# Patient Record
Sex: Female | Born: 1935 | Race: White | Hispanic: No | Marital: Married | Smoking: Never smoker
Health system: Southern US, Community
[De-identification: ages and names within clinical notes are randomized; demographics above are authoritative.]

## PROBLEM LIST (undated history)

## (undated) DIAGNOSIS — C50919 Malignant neoplasm of unspecified site of unspecified female breast: Secondary | ICD-10-CM

## (undated) DIAGNOSIS — K219 Gastro-esophageal reflux disease without esophagitis: Secondary | ICD-10-CM

## (undated) DIAGNOSIS — I10 Essential (primary) hypertension: Secondary | ICD-10-CM

## (undated) DIAGNOSIS — M6281 Muscle weakness (generalized): Secondary | ICD-10-CM

## (undated) DIAGNOSIS — E039 Hypothyroidism, unspecified: Secondary | ICD-10-CM

## (undated) DIAGNOSIS — F0390 Unspecified dementia without behavioral disturbance: Secondary | ICD-10-CM

## (undated) HISTORY — PX: JOINT REPLACEMENT: SHX530

---

## 1999-03-05 DIAGNOSIS — C50919 Malignant neoplasm of unspecified site of unspecified female breast: Secondary | ICD-10-CM

## 1999-03-05 HISTORY — PX: BREAST EXCISIONAL BIOPSY: SUR124

## 1999-03-05 HISTORY — DX: Malignant neoplasm of unspecified site of unspecified female breast: C50.919

## 2003-12-06 ENCOUNTER — Ambulatory Visit: Payer: Self-pay | Admitting: Occupational Therapy

## 2004-03-23 ENCOUNTER — Ambulatory Visit: Payer: Self-pay | Admitting: Oncology

## 2004-08-10 ENCOUNTER — Ambulatory Visit: Payer: Self-pay | Admitting: Oncology

## 2004-09-06 ENCOUNTER — Ambulatory Visit: Payer: Self-pay | Admitting: Oncology

## 2004-10-02 ENCOUNTER — Ambulatory Visit: Payer: Self-pay | Admitting: Oncology

## 2005-03-13 ENCOUNTER — Ambulatory Visit: Payer: Self-pay | Admitting: Oncology

## 2005-04-04 ENCOUNTER — Ambulatory Visit: Payer: Self-pay | Admitting: Oncology

## 2005-08-12 ENCOUNTER — Ambulatory Visit: Payer: Self-pay | Admitting: Oncology

## 2005-09-09 ENCOUNTER — Ambulatory Visit: Payer: Self-pay | Admitting: Oncology

## 2006-03-12 ENCOUNTER — Ambulatory Visit: Payer: Self-pay | Admitting: Oncology

## 2006-04-04 ENCOUNTER — Ambulatory Visit: Payer: Self-pay | Admitting: Oncology

## 2006-08-18 ENCOUNTER — Ambulatory Visit: Payer: Self-pay | Admitting: Oncology

## 2007-03-05 ENCOUNTER — Ambulatory Visit: Payer: Self-pay | Admitting: Oncology

## 2007-03-23 ENCOUNTER — Ambulatory Visit: Payer: Self-pay | Admitting: Oncology

## 2007-04-05 ENCOUNTER — Ambulatory Visit: Payer: Self-pay | Admitting: Oncology

## 2007-06-09 ENCOUNTER — Encounter: Payer: Self-pay | Admitting: Unknown Physician Specialty

## 2007-07-03 ENCOUNTER — Encounter: Payer: Self-pay | Admitting: Unknown Physician Specialty

## 2007-08-03 ENCOUNTER — Encounter: Payer: Self-pay | Admitting: Unknown Physician Specialty

## 2007-08-21 ENCOUNTER — Ambulatory Visit: Payer: Self-pay | Admitting: Oncology

## 2007-09-02 ENCOUNTER — Ambulatory Visit: Payer: Self-pay | Admitting: Oncology

## 2007-09-08 ENCOUNTER — Ambulatory Visit: Payer: Self-pay | Admitting: Oncology

## 2007-10-03 ENCOUNTER — Ambulatory Visit: Payer: Self-pay | Admitting: Oncology

## 2008-08-22 ENCOUNTER — Ambulatory Visit: Payer: Self-pay | Admitting: Oncology

## 2009-08-02 ENCOUNTER — Ambulatory Visit: Payer: Self-pay | Admitting: Oncology

## 2009-08-23 ENCOUNTER — Ambulatory Visit: Payer: Self-pay | Admitting: Oncology

## 2009-08-25 ENCOUNTER — Ambulatory Visit: Payer: Self-pay | Admitting: Oncology

## 2009-09-01 ENCOUNTER — Ambulatory Visit: Payer: Self-pay | Admitting: Oncology

## 2010-08-27 ENCOUNTER — Ambulatory Visit: Payer: Self-pay | Admitting: Oncology

## 2010-08-28 ENCOUNTER — Ambulatory Visit: Payer: Self-pay | Admitting: Oncology

## 2010-09-02 ENCOUNTER — Ambulatory Visit: Payer: Self-pay | Admitting: Oncology

## 2011-02-09 ENCOUNTER — Emergency Department: Payer: Self-pay | Admitting: *Deleted

## 2011-03-05 HISTORY — PX: OTHER SURGICAL HISTORY: SHX169

## 2011-08-27 ENCOUNTER — Ambulatory Visit: Payer: Self-pay | Admitting: Oncology

## 2011-08-29 ENCOUNTER — Ambulatory Visit: Payer: Self-pay | Admitting: Oncology

## 2011-11-19 ENCOUNTER — Inpatient Hospital Stay: Payer: Self-pay | Admitting: Specialist

## 2011-11-19 LAB — URINALYSIS, COMPLETE
Bacteria: NONE SEEN
Blood: NEGATIVE
Glucose,UR: NEGATIVE mg/dL (ref 0–75)
Nitrite: NEGATIVE
Ph: 6 (ref 4.5–8.0)
Protein: NEGATIVE
RBC,UR: <1 /HPF (ref 0–5)
Specific Gravity: 1.013 (ref 1.003–1.030)
Squamous Epithelial: NONE SEEN

## 2011-11-19 LAB — COMPREHENSIVE METABOLIC PANEL
Albumin: 4.1 g/dL (ref 3.4–5.0)
Alkaline Phosphatase: 83 U/L (ref 50–136)
Anion Gap: 10 (ref 7–16)
BUN: 15 mg/dL (ref 7–18)
Bilirubin,Total: 0.6 mg/dL (ref 0.2–1.0)
Calcium, Total: 9.4 mg/dL (ref 8.5–10.1)
Chloride: 107 mmol/L (ref 98–107)
Co2: 25 mmol/L (ref 21–32)
Creatinine: 0.65 mg/dL (ref 0.60–1.30)
EGFR (African American): >60
EGFR (Non-African Amer.): >60
Osmolality: 286 (ref 275–301)
Potassium: 3.8 mmol/L (ref 3.5–5.1)

## 2011-11-19 LAB — CBC WITH DIFFERENTIAL/PLATELET
Basophil #: 0 10*3/uL (ref 0.0–0.1)
Basophil %: 0.4 %
Eosinophil #: 0 10*3/uL (ref 0.0–0.7)
Eosinophil %: 0.5 %
HCT: 47.7 % — ABNORMAL HIGH (ref 35.0–47.0)
Lymphocyte #: 0.7 10*3/uL — ABNORMAL LOW (ref 1.0–3.6)
MCV: 91 fL (ref 80–100)
Monocyte #: 0.7 x10 3/mm (ref 0.2–0.9)
Platelet: 147 10*3/uL — ABNORMAL LOW (ref 150–440)
RBC: 5.26 10*6/uL — ABNORMAL HIGH (ref 3.80–5.20)
WBC: 10.7 10*3/uL (ref 3.6–11.0)

## 2011-11-19 LAB — PROTIME-INR
INR: 1
Prothrombin Time: 13.7 secs (ref 11.5–14.7)

## 2011-11-20 LAB — BASIC METABOLIC PANEL
Anion Gap: 9 (ref 7–16)
BUN: 14 mg/dL (ref 7–18)
Calcium, Total: 8.6 mg/dL (ref 8.5–10.1)
Chloride: 104 mmol/L (ref 98–107)
Co2: 27 mmol/L (ref 21–32)
Creatinine: 0.64 mg/dL (ref 0.60–1.30)
EGFR (African American): >60
EGFR (Non-African Amer.): >60
Glucose: 123 mg/dL — ABNORMAL HIGH (ref 65–99)
Osmolality: 281 (ref 275–301)
Potassium: 3.4 mmol/L — ABNORMAL LOW (ref 3.5–5.1)
Sodium: 140 mmol/L (ref 136–145)

## 2011-11-20 LAB — HEPATIC FUNCTION PANEL A (ARMC)
Alkaline Phosphatase: 66 U/L (ref 50–136)
SGOT(AST): 56 U/L — ABNORMAL HIGH (ref 15–37)
SGPT (ALT): 65 U/L (ref 12–78)
Total Protein: 6.1 g/dL — ABNORMAL LOW (ref 6.4–8.2)

## 2011-11-20 LAB — CBC WITH DIFFERENTIAL/PLATELET
Basophil %: 0.7 %
Eosinophil %: 2 %
HGB: 13.9 g/dL (ref 12.0–16.0)
Lymphocyte #: 0.6 10*3/uL — ABNORMAL LOW (ref 1.0–3.6)
MCH: 31.2 pg (ref 26.0–34.0)
MCV: 91 fL (ref 80–100)
Monocyte #: 0.5 x10 3/mm (ref 0.2–0.9)
Monocyte %: 6.4 %
Platelet: 108 10*3/uL — ABNORMAL LOW (ref 150–440)
RBC: 4.46 10*6/uL (ref 3.80–5.20)
RDW: 13.4 % (ref 11.5–14.5)
WBC: 8.1 10*3/uL (ref 3.6–11.0)

## 2011-11-21 LAB — CBC WITH DIFFERENTIAL/PLATELET
Basophil #: 0 10*3/uL (ref 0.0–0.1)
Basophil %: 0.8 %
Eosinophil %: 2.5 %
HCT: 38.6 % (ref 35.0–47.0)
HGB: 12.7 g/dL (ref 12.0–16.0)
Lymphocyte #: 0.6 10*3/uL — ABNORMAL LOW (ref 1.0–3.6)
Lymphocyte %: 11 %
MCH: 30.1 pg (ref 26.0–34.0)
MCHC: 33 g/dL (ref 32.0–36.0)
Monocyte #: 0.5 x10 3/mm (ref 0.2–0.9)
Monocyte %: 8.9 %
Platelet: 109 10*3/uL — ABNORMAL LOW (ref 150–440)
RBC: 4.23 10*6/uL (ref 3.80–5.20)

## 2011-11-21 LAB — BASIC METABOLIC PANEL
Anion Gap: 6 — ABNORMAL LOW (ref 7–16)
BUN: 9 mg/dL (ref 7–18)
Calcium, Total: 8.5 mg/dL (ref 8.5–10.1)
Chloride: 101 mmol/L (ref 98–107)
Co2: 30 mmol/L (ref 21–32)
EGFR (African American): >60
Osmolality: 273 (ref 275–301)

## 2011-11-22 LAB — BASIC METABOLIC PANEL
Anion Gap: 8 (ref 7–16)
BUN: 9 mg/dL (ref 7–18)
Chloride: 105 mmol/L (ref 98–107)
Creatinine: 0.55 mg/dL — ABNORMAL LOW (ref 0.60–1.30)
EGFR (Non-African Amer.): >60
Glucose: 104 mg/dL — ABNORMAL HIGH (ref 65–99)
Osmolality: 280 (ref 275–301)
Potassium: 3.4 mmol/L — ABNORMAL LOW (ref 3.5–5.1)
Sodium: 141 mmol/L (ref 136–145)

## 2011-11-22 LAB — HEMOGLOBIN: HGB: 12.2 g/dL (ref 12.0–16.0)

## 2011-11-25 LAB — PATHOLOGY REPORT

## 2012-10-29 ENCOUNTER — Ambulatory Visit: Payer: Self-pay

## 2013-10-15 ENCOUNTER — Ambulatory Visit: Payer: Self-pay

## 2014-06-21 NOTE — Consult Note (Signed)
PATIENT NAME:  Anita Powell, Anita Powell MR#:  [id number] DATE OF BIRTH:  [birth year]  DATE OF CONSULTATION:  11/19/2011  REFERRING PHYSICIAN:  Dr. Earnestine Leys  CONSULTING PHYSICIAN:  Belia Heman. Verdell Carmine, MD  PRIMARY CARE PHYSICIAN: Erma Pinto, FNP-C   REASON FOR CONSULTATION: Preoperative medical evaluation and medical management.   HISTORY OF PRESENT ILLNESS: This is an 81 year old female who apparently had a mechanical fall at a grocery store earlier today. She apparently drove herself home but was unable to bear any weight on her left lower extremity. She then called EMS and she was brought to the ER. She was noted to have an acute left femoral neck fracture. Hospitalist services were contacted for preoperative medical evaluation and medical management. Patient says her fall was likely mechanical in nature. She had no evidence of syncope. She had no chest pain, no dizziness, no palpitations, no shortness of breath and no other associated symptoms prior to her fall.   REVIEW OF SYSTEMS: CONSTITUTIONAL: No documented fever. No weight gain, no weight loss. EYES: No blurry or double vision. ENT: No tinnitus. No postnasal drip. No redness of the oropharynx. RESPIRATORY: No cough, no wheeze, no hemoptysis. CARDIOVASCULAR: No chest pain, no orthopnea, no palpitations, no syncope. GASTROINTESTINAL: No nausea, no vomiting, no diarrhea, no abdominal pain, no melena, no hematochezia. GENITOURINARY: No dysuria, no  hematuria. ENDOCRINE: No polyuria or nocturia. No heat or cold intolerance. HEME: No anemia, no bruising, no bleeding. INTEGUMENTARY: No rashes, no lesions. MUSCULOSKELETAL: No arthritis, no swelling, no gout. NEUROLOGIC: No numbness, no tingling, no ataxia, no seizure-type activity. PSYCH: No anxiety, no insomnia, no ADD.   PAST MEDICAL HISTORY:  1. Hypertension.  2. Hyperlipidemia.  3. Gastroesophageal reflux disease.  4. Hypothyroidism.   ALLERGIES: Sulfa drugs and penicillin.   SOCIAL HISTORY:  Used to smoke, quit many years ago. Does have 10 to 12 pack-year smoking history. No alcohol abuse. No illicit drug abuse. Lives at home with her husband.   FAMILY HISTORY: Mother died from pancreatic cancer. Father died from complications of heart disease.   CURRENT MEDICATIONS:  1. Aspirin 81 mg daily.  2. Synthroid 137 mcg daily.  3. Toprol 50 mg daily.  4. Omeprazole 20 mg daily.  5. Potassium 10 mEq daily.  6. Simvastatin 20 mg daily.  7. Vitamin D3 2000 international units daily.   PHYSICAL EXAMINATION ON ADMISSION:  VITAL SIGNS: Patient's vital signs are noted to be: Temperature 98.6, pulse 68, respirations 18, blood pressure 154/66, sats 92% on room air.   GENERAL: She is a pleasant appearing female in no apparent distress.   HEENT: She is atraumatic, normocephalic. Extraocular muscles are intact. Pupils equal, reactive to light. Sclerae anicteric. No conjunctival injection. No pharyngeal erythema.   NECK: Supple. There is no jugular venous distention, no bruits, no lymphadenopathy, no thyromegaly.   HEART: Regular rate and rhythm. No murmurs, no rubs, no clicks.   LUNGS: Clear to auscultation bilaterally. No rales, no rhonchi, no wheezes.   ABDOMEN: Soft, flat, nontender, nondistended. Has good bowel sounds. No hepatosplenomegaly appreciated.   EXTREMITIES: No evidence of any cyanosis, clubbing, or peripheral edema. She has +2 pedal and radial pulses bilaterally. Her left lower extremity is shortened and externally rotated due to the left femoral neck fracture.   SKIN: Moist, warm with no rashes.   LYMPHATIC: There is no cervical or axillary lymphadenopathy.   LABORATORY, DIAGNOSTIC, AND RADIOLOGICAL DATA: Serum glucose 128, BUN 15, creatinine 0.6, sodium 142, potassium 3.8, chloride 107, bicarbonate 25,  albumin 4.1. LFTs are mildly elevated AST 88, ALT 85. White cell count 10.7, hemoglobin 16.2, hematocrit 47.7, platelet count 147. Urinalysis is within normal limits.    Patient did have an x-ray of her hip showing a proximal left femoral fracture of the neck that appears to be subcapital.   ASSESSMENT AND PLAN: This is an 81 year old female with past medical history of hypertension, hyperlipidemia, hypothyroidism, gastroesophageal reflux disease came into the hospital after a fall and noted to have a left hip fracture.  1. Preoperative evaluation. Patient is likely a low to moderate risk for noncardiac surgery. There is no absolute contraindication to surgery at this time. Continue preoperative beta blocker. Patient's EKG has been reviewed, showed no acute changes.  2. Hypertension. Presently hemodynamically stable. Continue her Toprol. 3. Hypothyroidism. Continue Synthroid.  4. Hyperlipidemia. Continue simvastatin.  5. Abnormal liver function tests. This is a very mild LFT elevation, etiology unclear. Will go ahead and repeat her labs in the morning. It could be traumatic related to possibly her hip fracture.  6. CODE STATUS: Patient is a FULL CODE.   Thank you so much for the consultation. Will follow along with you.   TIME SPENT: 45 minutes.   ____________________________ Belia Heman. Verdell Carmine, MD vjs:cms D: 11/19/2011 18:16:35 ET T: 11/20/2011 08:09:08 ET JOB#: [id number]  cc: Belia Heman. Verdell Carmine, MD, <Dictator> Eather Colas. Sharlett Iles, FNP-C Henreitta Leber MD ELECTRONICALLY SIGNED 11/29/2011 12:16

## 2014-06-21 NOTE — Discharge Summary (Signed)
PATIENT NAME:  Anita Powell, Anita Powell MR#:  [id number] DATE OF BIRTH:  [birth year]  DATE OF ADMISSION:  11/19/2011 DATE OF DISCHARGE:  11/25/2011  FINAL DIAGNOSES:  1. Displaced subcapital fracture of the left hip.  2. Hypertension.  3. Hyperlipidemia.  4. Gastric reflux.  5. Hypothyroidism.  6. Mild confusion.  OPERATION: On 11/20/2011 left hip hemiarthroplasty with a Stryker Accolade prosthesis.   COMPLICATIONS: None.   CONSULTATION:  [facility].   DISCHARGE MEDICATIONS:  1. Vitamin D3 2000 units daily.  2. Synthroid 0.137 mg q. a.m. 3. Toprol-XL 50 mg daily.  4. Prilosec 20 mg q. a.m.  5. Zocor 10 mg at bedtime.  6. K-Dur 10 mEq daily.  7. Enteric-coated aspirin one p.o. b.i.d.  8. Norco 5/325 p.r.n. pain.   HISTORY OF PRESENT ILLNESS: The patient is an 81 year old female who fell in the parking lot at [employer] in [place] on the day of admission. She was able to get up and limp o her car and drove home. She was then brought to the [facility] Emergency Room by EMS. Exam and x-rays showed a displaced subcapital fracture of the left hip. She was admitted for medical evaluation and surgery.   PAST MEDICAL HISTORY:/ILLNESSES: As above.   MEDICATIONS: As above.   ALLERGIES: Sulfa and penicillin.  REVIEW OF SYSTEMS: Unremarkable.   FAMILY HISTORY: Mother died from pancreatic cancer. Father died from heart disease.   SOCIAL HISTORY: The patient quit smoking years ago. She does not drink. She lives at home with her husband.   PHYSICAL EXAMINATION: On admission, the patient was alert and cooperative. She was oriented. The left leg was shortened and externally rotated. There was severe pain with motion of the left hip. Neurovascular status was good. The skin was intact.   LABORATORY DATA: Laboratory data on admission was satisfactory.   HOSPITAL COURSE: On 11/20/2011 the patient was taken to surgery where she underwent a left hip hemiarthroplasty with a Stryker Accolade prosthesis. She did  well postoperatively with no significant drop in hemoglobin. She was gradually ambulated. She did have some confusion for a day or two but this cleared. She was making progress with physical therapy. She was stable and ready for skilled nursing discharge on 11/25/2011. She is to be partial weight-bearing on the left leg and to be seen in my office in two weeks for exam and x-ray.  Her rehabilitation potential is good.     ____________________________ Park Breed, MD hem:bjt D: 11/25/2011 12:57:20 ET T: 11/25/2011 13:11:36 ET JOB#: [id number]  cc: Park Breed, MD, <Dictator> Eather Colas. Sharlett Iles, FNP-C Park Breed MD ELECTRONICALLY SIGNED 11/26/2011 11:37

## 2014-06-21 NOTE — Consult Note (Signed)
Brief Consult Note: Diagnosis: 1. Pre-operative evaluation 2. HTN 3. Hypothyroidism 4. Hyperlipidemia 5. GERD.   Patient was seen by consultant.   Consult note dictated.   Recommend to proceed with surgery or procedure.   Orders entered.   Comments: 81 yo female w/ hx of HTN, Hyperlipidemia, Hypothyroidism, GERD, came into hospital after a fall and noted to have left hip fracture.   1. Pre-operative evaluation - pt. is likely a low to moderate risk for non-cardiac surgery.  - no contraindications to surgery at this time.   - cont. pre-operative B-blocker.  2. HTN - cont. Toprol 3. Hypothyroidism - cont. synthroid.  4. Hyperlipidemia - cont. Simvastatin  ECG reviewed.  Thanks for the consult and will follow with you.  Job # [id number].  Electronic Signatures: Henreitta Leber (MD)  (Signed 17-Sep-13 18:16)  Authored: Brief Consult Note   Last Updated: 17-Sep-13 18:16 by Henreitta Leber (MD)

## 2014-06-21 NOTE — H&P (Signed)
Subjective/Chief Complaint Pain left hip    History of Present Illness 81 year old female fell in parking lot at [employer] in [place] this AM injuring the left hip.  Drove home and then was brought to [facility] where exam and X-rays show a displaced subcapital fracture left hip.  Admit for medical evaluation and surgery tomorrow.  Discussed treatment options with patient and husband by phone and they agree with surgical treatment.  Risks and benefits of surgery were discussed at length including but not limited to infection, non union, nerve or blood vessed damage, non union, need for repeat surgery, blood clots and lung emboli, and death.    Past Medical Health Hypertension, Cancer, Breast 9 yrs ago   GERD   Hypothyroid    Primary Physician Laray Anger   ALLERGIES:  Sulfa drugs: Unknown  HOME MEDICATIONS: Medication Instructions Status  levothyroxine 137 mcg (0.137 mg) oral tablet 1 tab(s) orally once a day Active  aspirin 81 mg oral tablet 1 tab(s) orally once a day Active  metoprolol 50 mg oral tablet, extended release 1 tab(s) orally once a day Active  Vitamin D3 2000 intl units oral capsule 1 cap(s) orally once a day Active  omeprazole 20 mg oral delayed release capsule 1 cap(s) orally once a day Active  simvastatin 20 mg oral tablet 0.5 tab(s) orally once a day Active  potassium chloride 10 mEq oral tablet, extended release 1 tab(s) orally once a day Active   Family and Social History:   Family History Non-Contributory    Social History negative tobacco, negative ETOH    Place of Living Home   Review of Systems:   Fever/Chills No    Cough No    Sputum No    Abdominal Pain No   Physical Exam:   GEN well developed, well nourished, no acute distress    HEENT pink conjunctivae    NECK supple    RESP normal resp effort    CARD regular rate    ABD denies tenderness    GU foley catheter in place    LYMPH negative neck    EXTR  negative edema, Left leg short and rotated.  circulation/sensation/motor function good  pain with range of motion.  skin intact    SKIN normal to palpation    NEURO motor/sensory function intact    PSYCH alert, A+O to time, place, person, good insight   Lab Results: Hepatic:  17-Sep-13 15:59    Bilirubin, Total 0.6   Alkaline Phosphatase 83   SGPT (ALT)  85   SGOT (AST)  88   Total Protein, Serum 7.7   Albumin, Serum 4.1  Routine Chem:  17-Sep-13 15:59    Glucose, Serum  128   BUN 15   Creatinine (comp) 0.65   Sodium, Serum 142   Potassium, Serum 3.8   Chloride, Serum 107   CO2, Serum 25   Calcium (Total), Serum 9.4   Osmolality (calc) 286   eGFR (African American) >60   eGFR (Non-African American) >60 (eGFR values <73m/min/1.73 m2 may be an indication of chronic kidney disease (CKD). Calculated eGFR is useful in patients with stable renal function. The eGFR calculation will not be reliable in acutely ill patients when serum creatinine is changing rapidly. It is not useful in  patients on dialysis. The eGFR calculation may not be applicable to patients at the low and high extremes of body sizes, pregnant women, and vegetarians.)   Anion Gap 10  Routine UA:  17-Sep-13 15:59    Color (UA) Yellow   Clarity (UA) Clear   Glucose (UA) Negative   Bilirubin (UA) Negative   Ketones (UA) Trace   Specific Gravity (UA) 1.013   Blood (UA) Negative   pH (UA) 6.0   Protein (UA) Negative   Nitrite (UA) Negative   Leukocyte Esterase (UA) Negative (Result(s) reported on 19 Nov 2011 at 04:31PM.)   RBC (UA) <1 /HPF   WBC (UA) <1 /HPF   Bacteria (UA) NONE SEEN   Epithelial Cells (UA) NONE SEEN   Mucous (UA) PRESENT (Result(s) reported on 19 Nov 2011 at 04:31PM.)  Routine Hem:  17-Sep-13 15:59    WBC (CBC) 10.7   RBC (CBC)  5.26   Hemoglobin (CBC)  16.2   Hematocrit (CBC)  47.7   Platelet Count (CBC)  147   MCV 91   MCH 30.9   MCHC 34.0   RDW 13.8   Neutrophil % 86.4    Lymphocyte % 6.6   Monocyte % 6.1   Eosinophil % 0.5   Basophil % 0.4   Neutrophil #  9.2   Lymphocyte #  0.7   Monocyte # 0.7   Eosinophil # 0.0   Basophil # 0.0 (Result(s) reported on 19 Nov 2011 at 04:26PM.)     Assessment/Admission Diagnosis Displaced left subcapital hip fracture    Plan Left hip hemiarthroplasty in AM   Electronic Signatures: Park Breed (MD)  (Signed 17-Sep-13 17:53)  Authored: CHIEF COMPLAINT and HISTORY, ALLERGIES, HOME MEDICATIONS, FAMILY AND SOCIAL HISTORY, REVIEW OF SYSTEMS, PHYSICAL EXAM, LABS, ASSESSMENT AND PLAN   Last Updated: 17-Sep-13 17:53 by Park Breed (MD)

## 2014-06-21 NOTE — Op Note (Signed)
PATIENT NAME:  Anita Powell, Anita Powell MR#:  [id number] DATE OF BIRTH:  [birth year]  DATE OF PROCEDURE:  11/20/2011  PREOPERATIVE DIAGNOSIS: Displaced subcapital fracture of the left hip.   POSTOPERATIVE DIAGNOSIS: Displaced subcapital fracture of the left hip.   PROCEDURE PERFORMED: Left hip hemiarthroplasty (#3 Stryker Accolade stem, 45 mm unipolar ball, +4 neck length).   SURGEON: Park Breed, M.D.   ANESTHESIA: Spinal.   COMPLICATIONS: None.   DRAINS: Two Hemovacs.   ESTIMATED BLOOD LOSS: 300 mL.  REPLACEMENTS: None.   DESCRIPTION OF PROCEDURE: The patient was brought to the Operating Room where she underwent satisfactory spinal anesthesia and was placed in the right lateral decubitus position and padded appropriately on the beanbag. The left hip was prepped and draped in sterile fashion and a posterolateral incision made. Dissection was carried out sharply through subcutaneous tissue and fascia and a Charnley retractor inserted. The short external rotators were divided and tagged and the posterior capsule was opened and tagged. The femoral neck was cut with a saw and the femoral head was removed. It measured 45 mm trial. Trialing a 45 and 46 mm head showed that the 45 was the most stable. The femoral canal was then broached up to a #3 Accolade stem. A trial reduction was carried out with a 45 mm ball and a +4 neck length. This  showed good length and stability and tension. The trials were removed and the wound thoroughly irrigated. Excess soft tissue was removed from the base of the acetabulum. A #3 Stryker Accolade stem was inserted and seated snugly. A 45 mm unipolar ball with a +4 neck length was attached and this was reduced and was stable in all directions. The capsule was closed with #2 Tycron as were the short external rotators. The fascia was closed with running 0 Vicryl over a Hemovac drain and subcutaneous tissue was closed with 2-0 Vicryl over another Hemovac. Pulsating lavage was  used at all levels of closure. The skin was closed with staples, a dry sterile dressing was applied, and the Hemovac was activated. The patient was turned onto her back and leg lengths were excellent. She was transferred to her hospital bed and taken to recovery in good condition. ____________________________ Park Breed, MD hem:slb D: 11/20/2011 17:09:32 ET T: 11/20/2011 17:49:06 ET JOB#: [id number]  cc: Park Breed, MD, <Dictator> Park Breed MD ELECTRONICALLY SIGNED 11/21/2011 12:20

## 2014-09-28 ENCOUNTER — Other Ambulatory Visit: Payer: Self-pay | Admitting: Nurse Practitioner

## 2014-09-28 DIAGNOSIS — Z1231 Encounter for screening mammogram for malignant neoplasm of breast: Secondary | ICD-10-CM

## 2014-09-30 ENCOUNTER — Ambulatory Visit
Admission: RE | Admit: 2014-09-30 | Discharge: 2014-09-30 | Disposition: A | Discharge status: Home / Self Care | Payer: Medicare Other | Source: Ambulatory Visit | Attending: Nurse Practitioner | Admitting: Nurse Practitioner

## 2014-09-30 ENCOUNTER — Other Ambulatory Visit: Payer: Self-pay | Admitting: Nurse Practitioner

## 2014-09-30 DIAGNOSIS — Z1231 Encounter for screening mammogram for malignant neoplasm of breast: Secondary | ICD-10-CM | POA: Diagnosis present

## 2014-09-30 HISTORY — DX: Malignant neoplasm of unspecified site of unspecified female breast: C50.919

## 2015-04-07 ENCOUNTER — Emergency Department: Payer: Medicare Other

## 2015-04-07 ENCOUNTER — Inpatient Hospital Stay
Admission: EM | Admit: 2015-04-07 | Discharge: 2015-04-11 | DRG: 470 | Disposition: A | Discharge status: Skilled Nursing Facility | Payer: Medicare Other | Attending: Internal Medicine | Admitting: Internal Medicine

## 2015-04-07 ENCOUNTER — Encounter: Payer: Self-pay | Admitting: *Deleted

## 2015-04-07 DIAGNOSIS — Z923 Personal history of irradiation: Secondary | ICD-10-CM | POA: Diagnosis not present

## 2015-04-07 DIAGNOSIS — W010XXA Fall on same level from slipping, tripping and stumbling without subsequent striking against object, initial encounter: Secondary | ICD-10-CM | POA: Diagnosis present

## 2015-04-07 DIAGNOSIS — E876 Hypokalemia: Secondary | ICD-10-CM | POA: Diagnosis present

## 2015-04-07 DIAGNOSIS — E039 Hypothyroidism, unspecified: Secondary | ICD-10-CM | POA: Diagnosis present

## 2015-04-07 DIAGNOSIS — Z853 Personal history of malignant neoplasm of breast: Secondary | ICD-10-CM | POA: Diagnosis not present

## 2015-04-07 DIAGNOSIS — Z803 Family history of malignant neoplasm of breast: Secondary | ICD-10-CM | POA: Diagnosis not present

## 2015-04-07 DIAGNOSIS — Z85828 Personal history of other malignant neoplasm of skin: Secondary | ICD-10-CM | POA: Diagnosis not present

## 2015-04-07 DIAGNOSIS — I10 Essential (primary) hypertension: Secondary | ICD-10-CM | POA: Diagnosis present

## 2015-04-07 DIAGNOSIS — K219 Gastro-esophageal reflux disease without esophagitis: Secondary | ICD-10-CM | POA: Diagnosis present

## 2015-04-07 DIAGNOSIS — Z9889 Other specified postprocedural states: Secondary | ICD-10-CM

## 2015-04-07 DIAGNOSIS — Z9181 History of falling: Secondary | ICD-10-CM

## 2015-04-07 DIAGNOSIS — F0391 Unspecified dementia with behavioral disturbance: Secondary | ICD-10-CM | POA: Diagnosis present

## 2015-04-07 DIAGNOSIS — S72001A Fracture of unspecified part of neck of right femur, initial encounter for closed fracture: Secondary | ICD-10-CM | POA: Diagnosis present

## 2015-04-07 DIAGNOSIS — Z79899 Other long term (current) drug therapy: Secondary | ICD-10-CM

## 2015-04-07 DIAGNOSIS — Z7982 Long term (current) use of aspirin: Secondary | ICD-10-CM | POA: Diagnosis not present

## 2015-04-07 DIAGNOSIS — Z96649 Presence of unspecified artificial hip joint: Secondary | ICD-10-CM

## 2015-04-07 HISTORY — DX: Unspecified dementia, unspecified severity, without behavioral disturbance, psychotic disturbance, mood disturbance, and anxiety: F03.90

## 2015-04-07 HISTORY — DX: Essential (primary) hypertension: I10

## 2015-04-07 HISTORY — DX: Hypothyroidism, unspecified: E03.9

## 2015-04-07 LAB — CBC
HEMATOCRIT: 50.4 % — AB (ref 35.0–47.0)
HEMOGLOBIN: 16.9 g/dL — AB (ref 12.0–16.0)
MCH: 30.2 pg (ref 26.0–34.0)
MCHC: 33.6 g/dL (ref 32.0–36.0)
MCV: 90 fL (ref 80.0–100.0)
PLATELETS: 191 10*3/uL (ref 150–440)
RBC: 5.6 MIL/uL — AB (ref 3.80–5.20)
RDW: 14.7 % — ABNORMAL HIGH (ref 11.5–14.5)
WBC: 9.1 10*3/uL (ref 3.6–11.0)

## 2015-04-07 LAB — ABO/RH: ABO/RH(D): AB POS

## 2015-04-07 LAB — BASIC METABOLIC PANEL
ANION GAP: 11 (ref 5–15)
BUN: 11 mg/dL (ref 6–20)
CHLORIDE: 104 mmol/L (ref 101–111)
CO2: 27 mmol/L (ref 22–32)
Calcium: 9.5 mg/dL (ref 8.9–10.3)
Creatinine, Ser: 0.7 mg/dL (ref 0.44–1.00)
GFR calc Af Amer: >60 mL/min (ref >60)
GFR calc non Af Amer: >60 mL/min (ref >60)
GLUCOSE: 111 mg/dL — AB (ref 65–99)
POTASSIUM: 3.4 mmol/L — AB (ref 3.5–5.1)
Sodium: 142 mmol/L (ref 135–145)

## 2015-04-07 LAB — TYPE AND SCREEN
ABO/RH(D): AB POS
Antibody Screen: NEGATIVE

## 2015-04-07 LAB — PROTIME-INR
INR: 1.07
PROTHROMBIN TIME: 14.1 s (ref 11.4–15.0)

## 2015-04-07 LAB — APTT: aPTT: 28 seconds (ref 24–36)

## 2015-04-07 MED ORDER — PANTOPRAZOLE SODIUM 40 MG PO TBEC
40.0000 mg | DELAYED_RELEASE_TABLET | Freq: Two times a day (BID) | ORAL | Status: DC
  Administered 2015-04-07: 40 mg via ORAL
  Filled 2015-04-07: qty 1

## 2015-04-07 MED ORDER — VITAMIN D 1000 UNITS PO TABS
2000.0000 [IU] | ORAL_TABLET | Freq: Every day | ORAL | Status: DC
  Administered 2015-04-07 – 2015-04-10 (×4): 2000 [IU] via ORAL
  Filled 2015-04-07 (×4): qty 2

## 2015-04-07 MED ORDER — ONDANSETRON HCL 4 MG/2ML IJ SOLN: 4.0000 mg | Freq: Four times a day (QID) | INTRAMUSCULAR | Status: DC | PRN

## 2015-04-07 MED ORDER — OXYCODONE HCL 5 MG PO TABS: 5.0000 mg | ORAL_TABLET | ORAL | Status: DC | PRN

## 2015-04-07 MED ORDER — MEMANTINE HCL 10 MG PO TABS
10.0000 mg | ORAL_TABLET | Freq: Two times a day (BID) | ORAL | Status: DC
  Administered 2015-04-07 – 2015-04-11 (×7): 10 mg via ORAL
  Filled 2015-04-07 (×9): qty 1

## 2015-04-07 MED ORDER — ONDANSETRON HCL 4 MG PO TABS: 4.0000 mg | ORAL_TABLET | Freq: Four times a day (QID) | ORAL | Status: DC | PRN

## 2015-04-07 MED ORDER — METOPROLOL TARTRATE 1 MG/ML IV SOLN: 5.0000 mg | INTRAVENOUS | Status: DC | PRN

## 2015-04-07 MED ORDER — MAGNESIUM HYDROXIDE 400 MG/5ML PO SUSP: 30.0000 mL | Freq: Every day | ORAL | Status: DC | PRN

## 2015-04-07 MED ORDER — FLEET ENEMA 7-19 GM/118ML RE ENEM: 1.0000 | ENEMA | Freq: Once | RECTAL | Status: DC | PRN

## 2015-04-07 MED ORDER — BISACODYL 10 MG RE SUPP: 10.0000 mg | Freq: Every day | RECTAL | Status: DC | PRN

## 2015-04-07 MED ORDER — PANTOPRAZOLE SODIUM 40 MG PO TBEC: 40.0000 mg | DELAYED_RELEASE_TABLET | Freq: Every day | ORAL | Status: DC

## 2015-04-07 MED ORDER — SENNOSIDES-DOCUSATE SODIUM 8.6-50 MG PO TABS
1.0000 | ORAL_TABLET | Freq: Two times a day (BID) | ORAL | Status: DC
  Administered 2015-04-07 – 2015-04-10 (×5): 1 via ORAL
  Filled 2015-04-07 (×7): qty 1

## 2015-04-07 MED ORDER — CEFAZOLIN SODIUM-DEXTROSE 2-3 GM-% IV SOLR
2.0000 g | INTRAVENOUS | Status: AC
  Administered 2015-04-08: 2 g via INTRAVENOUS
  Filled 2015-04-07 (×2): qty 50

## 2015-04-07 MED ORDER — LEVOTHYROXINE SODIUM 25 MCG PO TABS
137.0000 ug | ORAL_TABLET | Freq: Every day | ORAL | Status: DC
  Administered 2015-04-09 – 2015-04-11 (×3): 137 ug via ORAL
  Filled 2015-04-07 (×3): qty 1

## 2015-04-07 MED ORDER — METOPROLOL SUCCINATE ER 50 MG PO TB24
50.0000 mg | ORAL_TABLET | Freq: Every day | ORAL | Status: DC
  Administered 2015-04-07 – 2015-04-11 (×4): 50 mg via ORAL
  Filled 2015-04-07 (×4): qty 1

## 2015-04-07 MED ORDER — MORPHINE SULFATE (PF) 2 MG/ML IV SOLN: 2.0000 mg | INTRAVENOUS | Status: DC | PRN

## 2015-04-07 MED ORDER — SENNA 8.6 MG PO TABS
1.0000 | ORAL_TABLET | Freq: Two times a day (BID) | ORAL | Status: DC
  Administered 2015-04-07: 8.6 mg via ORAL
  Filled 2015-04-07: qty 1

## 2015-04-07 NOTE — ED Notes (Signed)
Pt to ED via [facility] with right hip pain after mechanical fall today. Pt with shortening and external rotation noted to right leg. Pt AAOx4, vitals wnl, no acute distress noted. Pt denies pain unless leg is moved. Pedal pulses present and strong in bilateral lower extremities.

## 2015-04-07 NOTE — ED Notes (Addendum)
Trying to call report a second time, secretary states that she needs to speak to house sup and will call RN back. Charge RN [place] made aware at this time

## 2015-04-07 NOTE — ED Notes (Signed)
MD Paduchowski at bedside  

## 2015-04-07 NOTE — H&P (Signed)
[facility] [place] NAME: Anita Powell    MR#:  [id number]  DATE OF BIRTH:  04-07-1926  DATE OF ADMISSION:  04/07/2015  PRIMARY CARE PHYSICIAN: No primary care provider on file.   REQUESTING/REFERRING PHYSICIAN: Paduchowski, MD  CHIEF COMPLAINT:   Fall and right hip pain HISTORY OF PRESENT ILLNESS:  Anita Powell  is a 82 y.o. female with a known history of essential hypertension, hypothyroidism and breast cancer is presenting to the ED after she sustained a fall. Patient had left hip fracture and surgery last year. Currently she is having right hip pain and x-ray has revealed right hip fracture which is displaced. Patient is tentatively scheduled for surgery in a.m. by Dr. Marry Guan.  PAST MEDICAL HISTORY:   Past Medical History  Diagnosis Date  . Breast cancer ([place]) 2001    radiation  . Hypertension    Dementia, hypothyroidism PAST SURGICAL HISTOIRY:   Past Surgical History  Procedure Laterality Date  . Breast excisional biopsy Left 2001    positive   left hip surgery  SOCIAL HISTORY:   Social History  Substance Use Topics  . Smoking status: Never Smoker   . Smokeless tobacco: Not on file  . Alcohol Use: Yes     Comment: 3 oz scotch a day     FAMILY HISTORY:   Family History  Problem Relation Age of Onset  . Breast cancer Sister 63  . Breast cancer Paternal Aunt 59    DRUG ALLERGIES:  No Known Allergies  REVIEW OF SYSTEMS:  CONSTITUTIONAL: No fever, fatigue or weakness.  EYES: No blurred or double vision.  EARS, NOSE, AND THROAT: No tinnitus or ear pain.  RESPIRATORY: No cough, shortness of breath, wheezing or hemoptysis.  CARDIOVASCULAR: No chest pain, orthopnea, edema.  GASTROINTESTINAL: No nausea, vomiting, diarrhea or abdominal pain.  GENITOURINARY: No dysuria, hematuria.  ENDOCRINE: No polyuria, nocturia,  HEMATOLOGY: No anemia, easy bruising or bleeding SKIN: No rash or lesion. MUSCULOSKELETAL:  No joint pain or arthritis.  Reporting right hip pain NEUROLOGIC: No tingling, numbness, weakness.  PSYCHIATRY: No anxiety or depression.   MEDICATIONS AT HOME:   Prior to Admission medications   Medication Sig Start Date End Date Taking? Authorizing Provider  aspirin EC 81 MG tablet Take 81 mg by mouth at bedtime.   Yes Historical Provider, MD  cholecalciferol (VITAMIN D) 1000 units tablet Take 2,000 Units by mouth at bedtime.   Yes Historical Provider, MD  levothyroxine (SYNTHROID, LEVOTHROID) 137 MCG tablet Take 137 mcg by mouth daily before breakfast.   Yes Historical Provider, MD  memantine (NAMENDA) 10 MG tablet Take 10 mg by mouth 2 (two) times daily.   Yes Historical Provider, MD  metoprolol succinate (TOPROL-XL) 50 MG 24 hr tablet Take 50 mg by mouth daily. Take with or immediately following a meal.   Yes Historical Provider, MD  omeprazole (PRILOSEC) 20 MG capsule Take 20 mg by mouth daily.   Yes Historical Provider, MD      VITAL SIGNS:  Blood pressure 141/89, pulse 65, temperature 97.8 F (36.6 C), temperature source Oral, resp. rate 16, height 5\' 5"  (1.651 m), weight 63.504 kg (140 lb), SpO2 100 %.  PHYSICAL EXAMINATION:  GENERAL:  82 y.o.-year-old patient lying in the bed with no acute distress.  EYES: Pupils equal, round, reactive to light and accommodation. No scleral icterus. Extraocular muscles intact.  HEENT: Head atraumatic, normocephalic. Oropharynx and nasopharynx clear.  NECK:  Supple, no jugular  venous distention. No thyroid enlargement, no tenderness.  LUNGS: Normal breath sounds bilaterally, no wheezing, rales,rhonchi or crepitation. No use of accessory muscles of respiration.  CARDIOVASCULAR: S1, S2 normal. No murmurs, rubs, or gallops.  ABDOMEN: Soft, nontender, nondistended. Bowel sounds present. No organomegaly or mass.  EXTREMITIES: Right hip is tender and externally rotated. No pedal edema, cyanosis, or clubbing.  NEUROLOGIC: Cranial nerves II through XII  are intact. Muscle strength 5/5 in all extremities. Sensation intact. Gait not checked.  PSYCHIATRIC: The patient is alert and oriented x 3.  SKIN: No obvious rash, lesion, or ulcer.   LABORATORY PANEL:   CBC  Recent Labs Lab 04/07/15 1705  WBC 9.1  HGB 16.9*  HCT 50.4*  PLT 191   ------------------------------------------------------------------------------------------------------------------  Chemistries   Recent Labs Lab 04/07/15 1705  NA 142  K 3.4*  CL 104  CO2 27  GLUCOSE 111*  BUN 11  CREATININE 0.70  CALCIUM 9.5   ------------------------------------------------------------------------------------------------------------------  Cardiac Enzymes No results for input(s): TROPONINI in the last 168 hours. ------------------------------------------------------------------------------------------------------------------  RADIOLOGY:  Dg Chest 1 View  04/07/2015  CLINICAL DATA:  Fall.  Preop. EXAM: CHEST 1 VIEW COMPARISON:  11/19/2011 FINDINGS: Upper normal heart size. Clear lungs. Normal vascularity. Surgical clips in the left axilla. No pneumothorax. Osteopenia. No obvious acute bony deformity. IMPRESSION: No active disease. Electronically Signed   By: Marybelle Killings M.D.   On: 04/07/2015 17:02   Dg Hip Unilat With Pelvis 2-3 Views Right  04/07/2015  CLINICAL DATA:  Fall today, right hip pain EXAM: DG HIP (WITH OR WITHOUT PELVIS) 2-3V RIGHT COMPARISON:  None. FINDINGS: Four views of the right hip submitted. Diffuse osteopenia. There is displaced fracture of the right femoral neck. Partially visualized left hip prosthesis with anatomic alignment. Calcified fibroid is noted within pelvis. Degenerative changes lower lumbar spine. IMPRESSION: Diffuse osteopenia. Displaced fracture of the right femoral neck. Partially visualized left hip prosthesis with anatomic alignment. Electronically Signed   By: Lahoma Crocker M.D.   On: 04/07/2015 16:57    EKG:   Orders placed or performed  during the hospital encounter of 04/07/15  . ED EKG  . ED EKG  . EKG 12-Lead  . EKG 12-Lead  . EKG 12-Lead  . EKG 12-Lead    IMPRESSION AND PLAN:   #Acute displaced right femoral neck fracture Admit to MedSurg unit Pain management by orthopedics Consult also Dr. Marry Guan Medically cleared for surgery in a.m. NPO after midnight  #Essential hypertension blood pressure is elevated probably from stress and pain  #History of dementia, continue Namenda   #History of breast cancer currently patient is a cancer free. Outpatient follow-up with oncology for surveillance as recommended.  #Continue home medication metoprolol Will provide IV Lopressor as needed basis and pain management  #Hypothyroidism continue Synthroid  Provide DVT prophylaxis with SCDs and GI prophylaxis Protonix All the records are reviewed and case discussed with ED provider. Management plans discussed with the patient, family and they are in agreement.  CODE STATUS: fc, husband  TOTAL TIME TAKING CARE OF THIS PATIENT: 45 minutes.    Nicholes Mango M.D on 04/07/2015 at 6:41 PM  Between 7am to 6pm - Pager - [phone]  After 6pm go to [email or web address] - password [email or web address] [facility]  [facility]  Office  [phone]  CC: Primary care physician; No primary care provider on file.

## 2015-04-07 NOTE — ED Notes (Signed)
Anita Powell [phone] contact for pt's updates

## 2015-04-07 NOTE — ED Provider Notes (Addendum)
[facility] Emergency Department Provider Note  Time seen: 3:44 PM  I have reviewed the triage vital signs and the nursing notes.   HISTORY  Chief Complaint Fall and Hip Pain    HPI Anita Powell is a 82 y.o. female with a past medical history of hypertension, left hip fracture status post surgery last year, presents the emergency department with a fall and right hip pain. According to the patient she was in the kitchen when she tripped on her pet landing on her right side. She states she was unable to get up due to pain in the right hip area so she called EMS who brought her to the emergency department.  Patient states minimal pain when not moving, mild to moderate pain with movement of the right hip. Denies any other injuries. Denies back pain, neck pain or headache. Denies head injury or loss of consciousness.     Past Medical History  Diagnosis Date  . Breast cancer ([place]) 2001    radiation  . Hypertension     There are no active problems to display for this patient.   Past Surgical History  Procedure Laterality Date  . Breast excisional biopsy Left 2001    positive    No current outpatient prescriptions on file.  Allergies Review of patient's allergies indicates no known allergies.  Family History  Problem Relation Age of Onset  . Breast cancer Sister 36  . Breast cancer Paternal Aunt 21    Social History Social History  Substance Use Topics  . Smoking status: Never Smoker   . Smokeless tobacco: None  . Alcohol Use: Yes     Comment: 3 oz scotch a day     Review of Systems Constitutional: Negative for fever. Cardiovascular: Negative for chest pain. Respiratory: Negative for shortness of breath. Gastrointestinal: Negative for abdominal pain Musculoskeletal: Negative for back pain. Negative neck pain. Positive right hip pain with movement. Neurological: Negative for headache 10-point ROS otherwise  negative.  ____________________________________________   PHYSICAL EXAM:  VITAL SIGNS: ED Triage Vitals  Enc Vitals Group     BP 04/07/15 1538 180/87 mmHg     Pulse Rate 04/07/15 1538 63     Resp 04/07/15 1538 16     Temp 04/07/15 1538 97.8 F (36.6 C)     Temp Source 04/07/15 1538 Oral     SpO2 04/07/15 1538 100 %     Weight 04/07/15 1538 140 lb (63.504 kg)     Height 04/07/15 1538 5\' 5"  (1.651 m)     Head Cir --      Peak Flow --      Pain Score --      Pain Loc --      Pain Edu? --      Excl. in [place]? --    Constitutional: Alert and oriented. Well appearing and in no distress. Eyes: Normal exam ENT   Head: Normocephalic and atraumatic.   Mouth/Throat: Mucous membranes are moist. Cardiovascular: Normal rate, regular rhythm. No murmur Respiratory: Normal respiratory effort without tachypnea nor retractions. Breath sounds are clear  Gastrointestinal: Soft and nontender. No distention.   Musculoskeletal: Minimal tenderness to right hip palpation. Mild pain with range of motion. The leg is shortened and externally rotated however. Patient has 2+ DP pulse sensation intact. Neurologic:  Normal speech and language. No gross focal neurologic deficits  Skin:  Skin is warm, dry and intact.  Psychiatric: Mood and affect are normal. Speech and behavior are  normal. ____________________________________________   RADIOLOGY  X-ray consistent right femoral neck fracture. Chest x-ray shows no acute abnormalities  ____________________________________________   INITIAL IMPRESSION / ASSESSMENT AND PLAN / ED COURSE  Pertinent labs & imaging results that were available during my care of the patient were reviewed by me and considered in my medical decision making (see chart for details).  Patient presents with right hip pain after a fall. Patient has a shortened and externally rotated right lower extremity, neurovascular intact. Patient has minimal tenderness to palpation and mild  pain with range of motion. Given her findings, discomfort we will proceed with a right hip x-ray to further evaluate.  X-ray consistent with femoral neck fracture. I discussed patient with Dr. Bess Harvest. We will admit to the hospitalist for further treatment in clearance for surgery. Patient is aware.  EKG reviewed and interpreted by myself shows normal sinus rhythm at 60 bpm, narrow QRS, left axis deviation, nonspecific ST changes.  ____________________________________________   FINAL CLINICAL IMPRESSION(S) / ED DIAGNOSES  Fall Right hip fracture   Harvest Dark, MD 04/07/15 [place]  Harvest Dark, MD 04/07/15 1740

## 2015-04-07 NOTE — ED Notes (Signed)
Report attempted to be called, no RN's available at this time. Will call this RN back momentarily

## 2015-04-07 NOTE — Consult Note (Signed)
ORTHOPAEDIC CONSULTATION  PATIENT NAME: Anita Powell DOB: 1926-11-21  MRN: [id number]  REQUESTING PHYSICIAN: Nicholes Mango, MD  Chief Complaint: Right hip pain  HPI: Anita Powell is a 82 y.o. female who complains of  right hip pain. She sustained a mechanical fall when she tripped over her cats and landed on her right hip and side. She was unable to stand or bear weight on the right lower extremity due to the pain. She denied any loss of consciousness or other injury. Prior to the fall she uses a cane or other ambulatory aids for ambulation.  Past Medical History  Diagnosis Date  . Breast cancer ([place]) 2001    radiation  . Hypertension   . Hypothyroidism   . Dementia    Past Surgical History  Procedure Laterality Date  . Breast excisional biopsy Left 2001    positive  . Left hip hemiarthroplasty  2013   Social History   Social History  . Marital Status: Married    Spouse Name: N/A  . Number of Children: N/A  . Years of Education: N/A   Social History Main Topics  . Smoking status: Never Smoker   . Smokeless tobacco: None  . Alcohol Use: Yes     Comment: 3 oz scotch a day   . Drug Use: None  . Sexual Activity: Not Asked   Other Topics Concern  . None   Social History Narrative   Family History  Problem Relation Age of Onset  . Breast cancer Sister 64  . Breast cancer Paternal Aunt 16   No Known Allergies Prior to Admission medications   Medication Sig Start Date End Date Taking? Authorizing Provider  aspirin EC 81 MG tablet Take 81 mg by mouth at bedtime.   Yes Historical Provider, MD  cholecalciferol (VITAMIN D) 1000 units tablet Take 2,000 Units by mouth at bedtime.   Yes Historical Provider, MD  levothyroxine (SYNTHROID, LEVOTHROID) 137 MCG tablet Take 137 mcg by mouth daily before breakfast.   Yes Historical Provider, MD  memantine (NAMENDA) 10 MG tablet Take 10 mg by mouth 2 (two) times daily.   Yes Historical Provider, MD  metoprolol succinate  (TOPROL-XL) 50 MG 24 hr tablet Take 50 mg by mouth daily. Take with or immediately following a meal.   Yes Historical Provider, MD  omeprazole (PRILOSEC) 20 MG capsule Take 20 mg by mouth daily.   Yes Historical Provider, MD   Dg Chest 1 View  04/07/2015  CLINICAL DATA:  Fall.  Preop. EXAM: CHEST 1 VIEW COMPARISON:  11/19/2011 FINDINGS: Upper normal heart size. Clear lungs. Normal vascularity. Surgical clips in the left axilla. No pneumothorax. Osteopenia. No obvious acute bony deformity. IMPRESSION: No active disease. Electronically Signed   By: Marybelle Killings M.D.   On: 04/07/2015 17:02   Dg Hip Unilat With Pelvis 2-3 Views Right  04/07/2015  CLINICAL DATA:  Fall today, right hip pain EXAM: DG HIP (WITH OR WITHOUT PELVIS) 2-3V RIGHT COMPARISON:  None. FINDINGS: Four views of the right hip submitted. Diffuse osteopenia. There is displaced fracture of the right femoral neck. Partially visualized left hip prosthesis with anatomic alignment. Calcified fibroid is noted within pelvis. Degenerative changes lower lumbar spine. IMPRESSION: Diffuse osteopenia. Displaced fracture of the right femoral neck. Partially visualized left hip prosthesis with anatomic alignment. Electronically Signed   By: Lahoma Crocker M.D.   On: 04/07/2015 16:57   Positive ROS: All other systems have been reviewed and were otherwise negative with the exception  of those mentioned in the HPI and as above.  Physical Exam: General: Alert and alert in no acute distress. HEENT: Atraumatic and normocephalic. Sclera are clear. Extraocular motion is intact. Oropharynx is clear with moist mucosa. Neck: Supple, nontender, good range of motion. No JVD or carotid bruits. Lungs: Clear to auscultation bilaterally. Cardiovascular: Regular rate and rhythm with normal S1 and S2. No murmurs. No gallops or rubs. Pedal pulses are palpable bilaterally. Homans test is negative bilaterally. No significant pretibial or ankle edema. Abdomen: Soft, nontender, and  nondistended. Bowel sounds are present. Skin: No lesions in the area of chief complaint Neurologic: Awake, alert, and oriented. Sensory function is grossly intact. Motor strength is felt to be 5 over 5 bilaterally. No clonus or tremor. Good motor coordination. Lymphatic: No axillary or cervical lymphadenopathy  MUSCULOSKELETAL: The right lower extremity is shortened and externally rotated. Pain is elicited by attempts at range of motion of the right hip. The right knee is nontender. No knee effusion. No tenderness to the right foot and ankle.  Assessment: Right femoral neck fracture, displaced  Plan: The findings were discussed in detail with the patient. Recommendation was made for a right hip hemiarthroplasty. The usual perioperative course was discussed. The risks and benefits of surgical intervention were reviewed. The patient expressed understanding of the risks and benefits and agreed with plans for surgical intervention.   The surgical site was signed as per the "right site surgery" protocol.   James P. Holley Bouche M.D.

## 2015-04-08 ENCOUNTER — Inpatient Hospital Stay: Payer: Medicare Other | Admitting: Anesthesiology

## 2015-04-08 ENCOUNTER — Encounter
Admission: EM | Disposition: A | Discharge status: Skilled Nursing Facility | Payer: Self-pay | Source: Home / Self Care | Attending: Internal Medicine

## 2015-04-08 ENCOUNTER — Inpatient Hospital Stay: Payer: Medicare Other

## 2015-04-08 ENCOUNTER — Encounter: Payer: Self-pay | Admitting: Anesthesiology

## 2015-04-08 HISTORY — PX: HIP ARTHROPLASTY: SHX981

## 2015-04-08 LAB — PROTIME-INR
INR: 1.09
Prothrombin Time: 14.3 seconds (ref 11.4–15.0)

## 2015-04-08 LAB — BASIC METABOLIC PANEL
ANION GAP: 10 (ref 5–15)
BUN: 8 mg/dL (ref 6–20)
CALCIUM: 9.1 mg/dL (ref 8.9–10.3)
CO2: 27 mmol/L (ref 22–32)
Chloride: 102 mmol/L (ref 101–111)
Creatinine, Ser: 0.67 mg/dL (ref 0.44–1.00)
GFR calc Af Amer: >60 mL/min (ref >60)
GFR calc non Af Amer: >60 mL/min (ref >60)
GLUCOSE: 143 mg/dL — AB (ref 65–99)
POTASSIUM: 3.5 mmol/L (ref 3.5–5.1)
Sodium: 139 mmol/L (ref 135–145)

## 2015-04-08 LAB — CBC
HCT: 48.4 % — ABNORMAL HIGH (ref 35.0–47.0)
Hemoglobin: 16.3 g/dL — ABNORMAL HIGH (ref 12.0–16.0)
MCH: 31 pg (ref 26.0–34.0)
MCHC: 33.6 g/dL (ref 32.0–36.0)
MCV: 92.1 fL (ref 80.0–100.0)
Platelets: 184 10*3/uL (ref 150–440)
RBC: 5.25 MIL/uL — AB (ref 3.80–5.20)
RDW: 14.4 % (ref 11.5–14.5)
WBC: 9.3 10*3/uL (ref 3.6–11.0)

## 2015-04-08 LAB — URINALYSIS COMPLETE WITH MICROSCOPIC (ARMC ONLY)
Bilirubin Urine: NEGATIVE
Glucose, UA: NEGATIVE mg/dL
Hgb urine dipstick: NEGATIVE
LEUKOCYTES UA: NEGATIVE
NITRITE: NEGATIVE
PH: 6 (ref 5.0–8.0)
PROTEIN: NEGATIVE mg/dL
SPECIFIC GRAVITY, URINE: 1.011 (ref 1.005–1.030)

## 2015-04-08 LAB — MRSA PCR SCREENING: MRSA BY PCR: NEGATIVE

## 2015-04-08 SURGERY — HEMIARTHROPLASTY, HIP, DIRECT ANTERIOR APPROACH, FOR FRACTURE
Anesthesia: Spinal | Laterality: Right

## 2015-04-08 MED ORDER — ENOXAPARIN SODIUM 30 MG/0.3ML ~~LOC~~ SOLN: 30.0000 mg | SUBCUTANEOUS | Status: DC

## 2015-04-08 MED ORDER — ONDANSETRON HCL 4 MG PO TABS: 4.0000 mg | ORAL_TABLET | Freq: Four times a day (QID) | ORAL | Status: DC | PRN

## 2015-04-08 MED ORDER — TRAMADOL HCL 50 MG PO TABS: 50.0000 mg | ORAL_TABLET | ORAL | Status: DC | PRN

## 2015-04-08 MED ORDER — MORPHINE SULFATE (PF) 2 MG/ML IV SOLN: 2.0000 mg | INTRAVENOUS | Status: DC | PRN

## 2015-04-08 MED ORDER — TETRACAINE HCL 1 % IJ SOLN
INTRAMUSCULAR | Status: AC
  Filled 2015-04-08: qty 2

## 2015-04-08 MED ORDER — PHENYLEPHRINE HCL 10 MG/ML IJ SOLN
INTRAMUSCULAR | Status: AC
  Filled 2015-04-08: qty 1

## 2015-04-08 MED ORDER — METOCLOPRAMIDE HCL 5 MG PO TABS: 5.0000 mg | ORAL_TABLET | Freq: Three times a day (TID) | ORAL | Status: DC | PRN

## 2015-04-08 MED ORDER — FENTANYL CITRATE (PF) 100 MCG/2ML IJ SOLN
INTRAMUSCULAR | Status: DC | PRN
  Administered 2015-04-08: 50 ug via INTRAVENOUS

## 2015-04-08 MED ORDER — OXYCODONE HCL 5 MG/5ML PO SOLN: 5.0000 mg | Freq: Once | ORAL | Status: DC | PRN

## 2015-04-08 MED ORDER — LACTATED RINGERS IV SOLN
INTRAVENOUS | Status: DC | PRN
  Administered 2015-04-08 (×2): via INTRAVENOUS

## 2015-04-08 MED ORDER — ONDANSETRON HCL 4 MG/2ML IJ SOLN: 4.0000 mg | Freq: Four times a day (QID) | INTRAMUSCULAR | Status: DC | PRN

## 2015-04-08 MED ORDER — FENTANYL CITRATE (PF) 100 MCG/2ML IJ SOLN: 25.0000 ug | INTRAMUSCULAR | Status: DC | PRN

## 2015-04-08 MED ORDER — SODIUM CHLORIDE 0.9 % IV SOLN
10000.0000 ug | INTRAVENOUS | Status: DC | PRN
  Administered 2015-04-08: .2 ug/min via INTRAVENOUS

## 2015-04-08 MED ORDER — METOCLOPRAMIDE HCL 10 MG PO TABS
10.0000 mg | ORAL_TABLET | Freq: Three times a day (TID) | ORAL | Status: DC
Start: 2015-04-08 — End: 2015-04-09
  Administered 2015-04-08 (×2): 10 mg via ORAL
  Filled 2015-04-08 (×2): qty 1

## 2015-04-08 MED ORDER — MAGNESIUM HYDROXIDE 400 MG/5ML PO SUSP: 30.0000 mL | Freq: Every day | ORAL | Status: DC | PRN

## 2015-04-08 MED ORDER — OXYCODONE HCL 5 MG PO TABS: 5.0000 mg | ORAL_TABLET | ORAL | Status: DC | PRN

## 2015-04-08 MED ORDER — MENTHOL 3 MG MT LOZG
1.0000 | LOZENGE | OROMUCOSAL | Status: DC | PRN
  Filled 2015-04-08: qty 9

## 2015-04-08 MED ORDER — NEOMYCIN-POLYMYXIN B GU 40-200000 IR SOLN
Status: AC
  Filled 2015-04-08: qty 20

## 2015-04-08 MED ORDER — TETRACAINE HCL 1 % IJ SOLN
INTRAMUSCULAR | Status: DC | PRN
  Administered 2015-04-08: 10 mg via INTRASPINAL

## 2015-04-08 MED ORDER — BISACODYL 10 MG RE SUPP: 10.0000 mg | Freq: Every day | RECTAL | Status: DC | PRN

## 2015-04-08 MED ORDER — PROPOFOL 500 MG/50ML IV EMUL
INTRAVENOUS | Status: DC | PRN
  Administered 2015-04-08: 50 ug/kg/min via INTRAVENOUS

## 2015-04-08 MED ORDER — ACETAMINOPHEN 10 MG/ML IV SOLN
1000.0000 mg | Freq: Four times a day (QID) | INTRAVENOUS | Status: AC
  Administered 2015-04-08 – 2015-04-09 (×4): 1000 mg via INTRAVENOUS
  Filled 2015-04-08 (×4): qty 100

## 2015-04-08 MED ORDER — EPHEDRINE SULFATE 50 MG/ML IJ SOLN
INTRAMUSCULAR | Status: DC | PRN
  Administered 2015-04-08 (×3): 10 mg via INTRAVENOUS

## 2015-04-08 MED ORDER — ENOXAPARIN SODIUM 30 MG/0.3ML ~~LOC~~ SOLN
30.0000 mg | Freq: Two times a day (BID) | SUBCUTANEOUS | Status: DC
  Administered 2015-04-09 – 2015-04-11 (×5): 30 mg via SUBCUTANEOUS
  Filled 2015-04-08 (×5): qty 0.3

## 2015-04-08 MED ORDER — SODIUM CHLORIDE 0.9 % IV SOLN
INTRAVENOUS | Status: DC
  Administered 2015-04-08 – 2015-04-09 (×2): via INTRAVENOUS

## 2015-04-08 MED ORDER — ACETAMINOPHEN 10 MG/ML IV SOLN
INTRAVENOUS | Status: AC
  Administered 2015-04-08: 1000 mg via INTRAVENOUS
  Filled 2015-04-08: qty 100

## 2015-04-08 MED ORDER — CEFAZOLIN SODIUM-DEXTROSE 2-3 GM-% IV SOLR
2.0000 g | Freq: Four times a day (QID) | INTRAVENOUS | Status: DC
  Administered 2015-04-08 – 2015-04-09 (×4): 2 g via INTRAVENOUS
  Filled 2015-04-08 (×4): qty 50

## 2015-04-08 MED ORDER — METOCLOPRAMIDE HCL 5 MG/ML IJ SOLN
5.0000 mg | Freq: Three times a day (TID) | INTRAMUSCULAR | Status: DC | PRN
Start: 2015-04-08 — End: 2015-04-09

## 2015-04-08 MED ORDER — BUPIVACAINE HCL (PF) 0.5 % IJ SOLN
INTRAMUSCULAR | Status: DC | PRN
Start: 2015-04-08 — End: 2015-04-08
  Administered 2015-04-08: 2 mL

## 2015-04-08 MED ORDER — PHENOL 1.4 % MT LIQD
1.0000 | OROMUCOSAL | Status: DC | PRN
  Filled 2015-04-08: qty 177

## 2015-04-08 MED ORDER — OXYCODONE HCL 5 MG PO TABS: 5.0000 mg | ORAL_TABLET | Freq: Once | ORAL | Status: DC | PRN

## 2015-04-08 MED ORDER — NEOMYCIN-POLYMYXIN B GU 40-200000 IR SOLN
Status: DC | PRN
  Administered 2015-04-08: 16 mL

## 2015-04-08 MED ORDER — FERROUS SULFATE 325 (65 FE) MG PO TABS
325.0000 mg | ORAL_TABLET | Freq: Two times a day (BID) | ORAL | Status: DC
  Administered 2015-04-08 – 2015-04-11 (×6): 325 mg via ORAL
  Filled 2015-04-08 (×6): qty 1

## 2015-04-08 MED ORDER — ACETAMINOPHEN 325 MG PO TABS
650.0000 mg | ORAL_TABLET | Freq: Four times a day (QID) | ORAL | Status: DC | PRN
  Administered 2015-04-10: 650 mg via ORAL
  Filled 2015-04-08: qty 2

## 2015-04-08 MED ORDER — ACETAMINOPHEN 650 MG RE SUPP: 650.0000 mg | Freq: Four times a day (QID) | RECTAL | Status: DC | PRN

## 2015-04-08 MED ORDER — KETAMINE HCL 10 MG/ML IJ SOLN
INTRAMUSCULAR | Status: DC | PRN
  Administered 2015-04-08 (×4): 10 mg via INTRAVENOUS

## 2015-04-08 MED ORDER — SENNOSIDES-DOCUSATE SODIUM 8.6-50 MG PO TABS: 1.0000 | ORAL_TABLET | Freq: Two times a day (BID) | ORAL | Status: DC

## 2015-04-08 MED ORDER — FLEET ENEMA 7-19 GM/118ML RE ENEM: 1.0000 | ENEMA | Freq: Once | RECTAL | Status: DC | PRN

## 2015-04-08 MED ORDER — ACETAMINOPHEN 10 MG/ML IV SOLN
INTRAVENOUS | Status: AC
  Filled 2015-04-08: qty 100

## 2015-04-08 SURGICAL SUPPLY — 56 items
BAG DECANTER FOR FLEXI CONT (MISCELLANEOUS) ×3 IMPLANT
BLADE SAW 1 (BLADE) ×3 IMPLANT
CANISTER SUCT 1200ML W/VALVE (MISCELLANEOUS) IMPLANT
CANISTER SUCT 3000ML (MISCELLANEOUS) ×6 IMPLANT
CAPT HIP HEMI 2 ×3 IMPLANT
CATH FOL LEG HOLDER (MISCELLANEOUS) IMPLANT
CEMENT HV SMART SET (Cement) ×6 IMPLANT
DRAPE INCISE IOBAN 66X60 STRL (DRAPES) ×3 IMPLANT
DRAPE SHEET LG 3/4 BI-LAMINATE (DRAPES) ×3 IMPLANT
DRAPE TABLE BACK 80X90 (DRAPES) ×3 IMPLANT
DRSG DERMACEA 8X12 NADH (GAUZE/BANDAGES/DRESSINGS) ×3 IMPLANT
DRSG OPSITE POSTOP 4X12 (GAUZE/BANDAGES/DRESSINGS) ×3 IMPLANT
DRSG OPSITE POSTOP 4X14 (GAUZE/BANDAGES/DRESSINGS) ×3 IMPLANT
DRSG TEGADERM 4X4.75 (GAUZE/BANDAGES/DRESSINGS) ×3 IMPLANT
DURAPREP 26ML APPLICATOR (WOUND CARE) ×6 IMPLANT
ELECT BLADE 6.5 EXT (BLADE) ×3 IMPLANT
ELECT CAUTERY BLADE 6.4 (BLADE) ×3 IMPLANT
ELECT REM PT RETURN 9FT ADLT (ELECTROSURGICAL) ×3
ELECTRODE REM PT RTRN 9FT ADLT (ELECTROSURGICAL) ×1 IMPLANT
GAUZE PACK 2X3YD (MISCELLANEOUS) ×3 IMPLANT
GLOVE BIO SURGEON STRL SZ8 (GLOVE) ×9 IMPLANT
GLOVE BIOGEL M STRL SZ7.5 (GLOVE) ×6 IMPLANT
GLOVE BIOGEL PI IND STRL 9 (GLOVE) IMPLANT
GLOVE BIOGEL PI INDICATOR 9 (GLOVE)
GLOVE INDICATOR 8.0 STRL GRN (GLOVE) ×3 IMPLANT
GOWN STRL REUS W/ TWL LRG LVL3 (GOWN DISPOSABLE) ×2 IMPLANT
GOWN STRL REUS W/TWL 2XL LVL3 (GOWN DISPOSABLE) ×3 IMPLANT
GOWN STRL REUS W/TWL LRG LVL3 (GOWN DISPOSABLE) ×4
HANDPIECE SUCTION TUBG SURGILV (MISCELLANEOUS) ×3 IMPLANT
HEMOVAC 400CC 10FR (MISCELLANEOUS) ×3 IMPLANT
HOOD PEEL AWAY FLYTE STAYCOOL (MISCELLANEOUS) ×3 IMPLANT
IV NS 100ML SINGLE PACK (IV SOLUTION) ×3 IMPLANT
KIT RM TURNOVER STRD PROC AR (KITS) ×3 IMPLANT
NDL SAFETY 18GX1.5 (NEEDLE) ×3 IMPLANT
NEEDLE FILTER BLUNT 18X 1/2SAF (NEEDLE) ×2
NEEDLE FILTER BLUNT 18X1 1/2 (NEEDLE) ×1 IMPLANT
NS IRRIG 1000ML POUR BTL (IV SOLUTION) ×3 IMPLANT
PACK HIP PROSTHESIS (MISCELLANEOUS) ×3 IMPLANT
PRESSURIZER CEMENT PROX FEM SM (MISCELLANEOUS) ×3 IMPLANT
PRESSURIZER FEM CANAL M (MISCELLANEOUS) IMPLANT
SOL .9 NS 3000ML IRR  AL (IV SOLUTION) ×2
SOL .9 NS 3000ML IRR UROMATIC (IV SOLUTION) ×1 IMPLANT
SOL PREP PVP 2OZ (MISCELLANEOUS) ×3
SOLUTION PREP PVP 2OZ (MISCELLANEOUS) ×1 IMPLANT
SPONGE DRAIN TRACH 4X4 STRL 2S (GAUZE/BANDAGES/DRESSINGS) ×3 IMPLANT
STAPLER SKIN PROX 35W (STAPLE) ×3 IMPLANT
SUT ETHIBOND #5 BRAIDED 30INL (SUTURE) ×3 IMPLANT
SUT VIC AB 0 CT1 36 (SUTURE) ×3 IMPLANT
SUT VIC AB 1 CT1 36 (SUTURE) ×6 IMPLANT
SUT VIC AB 2-0 CT1 27 (SUTURE) ×2
SUT VIC AB 2-0 CT1 TAPERPNT 27 (SUTURE) ×1 IMPLANT
SYR 20CC LL (SYRINGE) ×3 IMPLANT
SYR TB 1ML LUER SLIP (SYRINGE) ×3 IMPLANT
TAPE TRANSPORE STRL 2 31045 (GAUZE/BANDAGES/DRESSINGS) ×3 IMPLANT
TIP COAXIAL FEMORAL CANAL (MISCELLANEOUS) IMPLANT
TOWER CARTRIDGE SMART MIX (DISPOSABLE) ×3 IMPLANT

## 2015-04-08 NOTE — Progress Notes (Signed)
Patient ID: Anita Powell, female   DOB: [birth year], 82 y.o.   MRN: [id number] [facility] PROGRESS NOTE  Anita Powell [id number] DOB: [birth year] DOA: 04/07/2015 PCP: No primary care provider on file.  HPI/Subjective: Patient seen postoperatively. Had no pain when I saw her earlier but the spinal anesthesia did not wear off yet. Breathing okay.  Objective: Filed Vitals:   04/08/15 1440 04/08/15 1537  BP: 107/51 97/53  Pulse: 56 54  Temp:    Resp: 18 18    Filed Weights   04/07/15 1538  Weight: 63.504 kg (140 lb)    ROS: Review of Systems  Constitutional: Negative for fever and chills.  Eyes: Negative for blurred vision.  Respiratory: Negative for cough and shortness of breath.   Cardiovascular: Negative for chest pain.  Gastrointestinal: Negative for nausea, vomiting, abdominal pain, diarrhea and constipation.  Genitourinary: Negative for dysuria.  Musculoskeletal: Negative for joint pain.  Neurological: Negative for dizziness and headaches.   Exam: Physical Exam  Constitutional: She is oriented to person, place, and time.  HENT:  Nose: No mucosal edema.  Mouth/Throat: No oropharyngeal exudate or posterior oropharyngeal edema.  Eyes: Conjunctivae, EOM and lids are normal. Pupils are equal, round, and reactive to light.  Neck: No JVD present. Carotid bruit is not present. No edema present. No thyroid mass and no thyromegaly present.  Cardiovascular: S1 normal and S2 normal.  Exam reveals no gallop.   No murmur heard. Pulses:      Dorsalis pedis pulses are 2+ on the right side, and 2+ on the left side.  Respiratory: No respiratory distress. She has no wheezes. She has no rhonchi. She has no rales.  GI: Soft. Bowel sounds are normal. There is no tenderness.  Musculoskeletal:       Right ankle: She exhibits no swelling.       Left ankle: She exhibits no swelling.  Lymphadenopathy:    She has no cervical adenopathy.  Neurological: She is alert and  oriented to person, place, and time.  Skin: Skin is warm. No rash noted. Nails show no clubbing.  Psychiatric: She has a normal mood and affect.      Data Reviewed: Basic Metabolic Panel:  Recent Labs Lab 04/07/15 1705 04/08/15 0440  NA 142 139  K 3.4* 3.5  CL 104 102  CO2 27 27  GLUCOSE 111* 143*  BUN 11 8  CREATININE 0.70 0.67  CALCIUM 9.5 9.1  CBC:  Recent Labs Lab 04/07/15 1705 04/08/15 0440  WBC 9.1 9.3  HGB 16.9* 16.3*  HCT 50.4* 48.4*  MCV 90.0 92.1  PLT 191 184    Studies: Dg Chest 1 View  04/07/2015  CLINICAL DATA:  Fall.  Preop. EXAM: CHEST 1 VIEW COMPARISON:  11/19/2011 FINDINGS: Upper normal heart size. Clear lungs. Normal vascularity. Surgical clips in the left axilla. No pneumothorax. Osteopenia. No obvious acute bony deformity. IMPRESSION: No active disease. Electronically Signed   By: Marybelle Killings M.D.   On: 04/07/2015 17:02   Dg Hip Port Unilat With Pelvis 1v Right  04/08/2015  CLINICAL DATA:  Post RIGHT hemi arthroplasty EXAM: DG HIP (WITH OR WITHOUT PELVIS) 1V PORT RIGHT COMPARISON:  Acute kyphotic digit FINDINGS: RIGHT hip total arthroplasty. RIGHT hip unipolar arthroplasty. No fracture or dislocation. Calcified fibroid. IMPRESSION: No complication following RIGHT hip arthroplasty. Electronically Signed   By: Suzy Bouchard M.D.   On: 04/08/2015 12:35   Dg Hip Unilat With Pelvis 2-3 Views Right  04/07/2015  CLINICAL  DATA:  Fall today, right hip pain EXAM: DG HIP (WITH OR WITHOUT PELVIS) 2-3V RIGHT COMPARISON:  None. FINDINGS: Four views of the right hip submitted. Diffuse osteopenia. There is displaced fracture of the right femoral neck. Partially visualized left hip prosthesis with anatomic alignment. Calcified fibroid is noted within pelvis. Degenerative changes lower lumbar spine. IMPRESSION: Diffuse osteopenia. Displaced fracture of the right femoral neck. Partially visualized left hip prosthesis with anatomic alignment. Electronically Signed   By:  Lahoma Crocker M.D.   On: 04/07/2015 16:57    Scheduled Meds: . acetaminophen  1,000 mg Intravenous 4 times per day  .  ceFAZolin (ANCEF) IV  2 g Intravenous Q6H  . cholecalciferol  2,000 Units Oral QHS  . [START ON 04/09/2015] enoxaparin (LOVENOX) injection  30 mg Subcutaneous Q12H  . ferrous sulfate  325 mg Oral BID WC  . levothyroxine  137 mcg Oral QAC breakfast  . memantine  10 mg Oral BID  . metoCLOPramide  10 mg Oral TID AC & HS  . metoprolol succinate  50 mg Oral Daily  . senna-docusate  1 tablet Oral BID   Continuous Infusions: . sodium chloride 100 mL/hr at 04/08/15 1321    Assessment/Plan:  1. Essential hypertension- blood pressure on the lower side. Holding parameters on metoprolol. Continue IV fluids. 2. Hypothyroidism unspecified continue levothyroxin 3. Dementia without behavioral disturbance on Namenda 4. Right hip fracture requiring operative repair. Pain control as needed. Likely up to rehabilitation on Tuesday.  Code Status:  Code Status History    Date Active Date Inactive Code Status Order ID Comments User Context   04/07/2015  8:17 PM 04/08/2015  1:08 PM Full Code [id number]  Nicholes Mango, MD Inpatient   04/07/2015  5:49 PM 04/07/2015  8:17 PM Full Code [id number]  Dereck Leep, MD ED     Family Communication: Family at bedside Disposition Plan: To rehabilitation on Tuesday  Consultants:  Orthopedic surgery  Time spent: 25 minutes  [place], [facility]

## 2015-04-08 NOTE — Progress Notes (Signed)
Subjective :Patient is day 2 admission for left hip fracture. Patient reports pain as moderate.   no nausea and no vomiting Patient is denying any chest pains or shortness of breath. Patient rested well on the night. Did have one episode where she broke out into a heavy sweat during the night.   Objective: Vital signs in last 24 hours: Temp:  [97.7 F (36.5 C)-98.8 F (37.1 C)] 97.7 F (36.5 C) (02/04 0401) Pulse Rate:  [63-77] 72 (02/04 0401) Resp:  [16-19] 18 (02/04 0401) BP: (141-180)/(72-89) 146/72 mmHg (02/04 0401) SpO2:  [90 %-100 %] 90 % (02/04 0401) Weight:  [63.504 kg (140 lb)] 63.504 kg (140 lb) (02/03 1538)   Intake/Output from previous day: 02/03 0701 - 02/04 0700 In: -  Out: 300 [Urine:300] Intake/Output this shift: Total I/O In: 200 [I.V.:200] Out: -    Recent Labs  04/07/15 1705 04/08/15 0440  HGB 16.9* 16.3*    Recent Labs  04/07/15 1705 04/08/15 0440  WBC 9.1 9.3  RBC 5.60* 5.25*  HCT 50.4* 48.4*  PLT 191 184    Recent Labs  04/07/15 1705 04/08/15 0440  NA 142 139  K 3.4* 3.5  CL 104 102  CO2 27 27  BUN 11 8  CREATININE 0.70 0.67  GLUCOSE 111* 143*  CALCIUM 9.5 9.1    Recent Labs  04/07/15 1705 04/08/15 0440  INR 1.07 1.09   troponins continued to increase. Now up to 1.3.  Neurologically intact Neurovascular intact Sensation intact distally Intact pulses distally Dorsiflexion/Plantar flexion intact  Assessment/Plan: Have discussed this case with medicine. Medicine feels that she is probably clear to Proceed with surgery. Hospitalist has contacted cardiology for the clearance as well. Patient has had a EKG as well as echocardiogram . Ejection fraction 66%. Mild regurgitation noted. Mild left ventricle hypertrophy. Surgery pending clearance from cardiology Case management to assist with discharge planning Physical therapy on hold for now Bowel movement today Labs in am Plan to discharge on Tuesday if medically  cleared   Anita Powell R. 04/08/2015, 8:34 AM

## 2015-04-08 NOTE — Progress Notes (Signed)
SLP Cancellation Note  Patient Details Name: Anita Powell MRN: [id number] DOB: February 13, 1927   Cancelled treatment:       Reason Eval/Treat Not Completed: Patient at procedure or test/unavailable Reviewed chart and spoke w/nsg. Pt is at surgery. Unable to eval at this time. Will re-attempt eval when able.    Snyder,Maaran 04/08/2015, 12:12 PM

## 2015-04-08 NOTE — Anesthesia Procedure Notes (Addendum)
Spinal Patient location during procedure: OR Start time: 04/08/2015 8:18 AM Staffing Anesthesiologist: Andria Frames Resident/CRNA: LOPEZ, IVETTE Performed by: resident/CRNA  Preanesthetic Checklist Completed: patient identified, site marked, surgical consent, pre-op evaluation, timeout performed, IV checked, risks and benefits discussed and monitors and equipment checked Spinal Block Patient position: left lateral decubitus Prep: ChloraPrep Patient monitoring: heart rate, cardiac monitor, continuous pulse ox and blood pressure Approach: midline Location: L3-4 Injection technique: single-shot Needle Needle type: Introducer  Assessment Sensory level: T6 Additional Notes Clear csf through introducer - local injected through introducer. No paresthesia

## 2015-04-08 NOTE — Brief Op Note (Signed)
04/07/2015 - 04/08/2015  11:30 AM  PATIENT:  Anita Powell  82 y.o. female  PRE-OPERATIVE DIAGNOSIS:  right femoral neck fracture  POST-OPERATIVE DIAGNOSIS:  same  PROCEDURE:   Right hip hemiarthroplasty  SURGEON:  Surgeon(s) and Role:    * Dereck Leep, MD - Primary  ASSISTANTS: none   ANESTHESIA:   spinal  EBL:  Total I/O In: 946 [I.V.:946] Out: 300 [Urine:250; Blood:50]  BLOOD ADMINISTERED:none  DRAINS: 2 medium Hemovac drains   LOCAL MEDICATIONS USED:  NONE  SPECIMEN:  Source of Specimen:  Right femoral head  DISPOSITION OF SPECIMEN:  PATHOLOGY  COUNTS:  YES  TOURNIQUET:  * No tourniquets in log *  DICTATION: .Dragon Dictation  PLAN OF CARE: Admit to inpatient   PATIENT DISPOSITION:  PACU - hemodynamically stable.   Delay start of Pharmacological VTE agent (>24hrs) due to surgical blood loss or risk of bleeding: yes

## 2015-04-08 NOTE — Op Note (Signed)
OPERATIVE NOTE  DATE OF SURGERY:  04/07/2015 - 04/08/2015  PATIENT NAME:  Anita Powell   DOB: 10/16/1926  MRN: [id number]  PRE-OPERATIVE DIAGNOSIS: Right femoral neck fracture  POST-OPERATIVE DIAGNOSIS:  Same  PROCEDURE:  Right hip hemiarthroplasty  SURGEON:  Marciano Sequin. M.D.  ANESTHESIA: spinal  ESTIMATED BLOOD LOSS: 50 mL  FLUIDS REPLACED: 950 mL of crystalloid  DRAINS: 2 medium drains to a Hemovac reservoir  IMPLANTS UTILIZED: DePuy size 3 Summit femoral stem (cemented), 11 mm Cementralizer, 46 mm OD Cathcart hip ball, +0 mm tapered spacer, and a size 3 femoral cement restrictor  INDICATIONS FOR SURGERY: Anita Powell is a 82 y.o. year old female who fell and sustained a displaced right femoral neck fracture on 04/07/2015. After discussion of the risks and benefits of surgical intervention, the patient expressed understanding of the risks benefits and agree with plans for hip hemiarthroplasty.   The risks, benefits, and alternatives were discussed at length including but not limited to the risks of infection, bleeding, nerve injury, stiffness, blood clots, the need for revision surgery, limb length inequality, dislocation, cardiopulmonary complications, among others, and they were willing to proceed.  PROCEDURE IN DETAIL: The patient was brought into the operating room and, after adequate spinal anesthesia was achieved, patient was placed in a left lateral decubitus position. Axillary roll was placed and all bony prominences were well-padded. The patient's right hip was cleaned and prepped with alcohol and DuraPrep and draped in the usual sterile fashion. A "timeout" was performed as per usual protocol. A lateral curvilinear incision was made gently curving towards the posterior superior iliac spine. The IT band was incised in line with the skin incision and the fibers of the gluteus maximus were split in line. The piriformis tendon was identified, skeletonized, and incised at  its insertion to the proximal femur and reflected posteriorly. A T type posterior capsulotomy was performed. The femoral head was then removed using a corkscrew device. The femoral head was measured using calipers and ring gauges and determined to be 46 mm in diameter.The femoral neck cut was performed using an oscillating saw. The acetabulum was inspected for any bony fragments. The articular surface was in good condition.  Attention was then directed to the proximal femur. A pilot hole for preparation of the proximal femoral canal was created using a high-speed bur. The femoral canal finder was inserted followed by insertion of the conical reamer. Serial broaches were inserted up to a size 3 broach. Calcar region was planed and a trial reduction was performed using a 46 mm OD Cathcart ball with a +0 mm neck length. Good equalization of limb lengths was appreciated and excellent stability was noted both anteriorly and posteriorly. Trial components were removed. The femoral canal was sized and was felt that a size 3 cement restrictor was appropriate. The cement restrictor was inserted to the appropriate depth in the femoral canal was irrigated with copious amounts of fluid using the pulse lavage and suctioned dry. The femoral canal was then packed with vaginal packing soaked in dilute Neo-Synephrine. Polymethylmethacrylate cement was per usual fashion using a vacuum mixer. Vaginal packing was removed and the canal again irrigated and suctioned dry. The polymethylmethacrylate cement was inserted in retrograde fashion and pressurized. The size 3 Summit femoral component with a 11 mm Cementralizer was positioned and impacted into place. Excess cement was removed using [employer] fast streamer. After adequate curing of the cement, the Morse taper was cleaned and dried. A 46 mm outer  diameter Cathcart hip ball with a +0 mm tapered spacer was placed on the trunnion and impacted into place. The acetabulum was again irrigated and  suctioned dry, making sure to inspect for any residal bony debris. The femoral head was then reduced and placed through a range of motion. Excellent stability was noted both anteriorly and posteriorly. Good equalization of limb lengths was appreciated.   The wound was irrigated with copious amounts of normal saline with antibiotic solution and suctioned dry. Good hemostasis was appreciated. The posterior capsulotomy was repaired using #5 Ethibond. Piriformis tendon was reapproximated to the undersurface of the gluteus medius tendon using #5 Ethibond. Two medium drains were placed in the wound bed and brought out through separate stab incisions to be attached to a Hemovac reservoir. The IT band was reapproximated using interrupted sutures of #1 Vicryl. Subcutaneous tissue was proximal phalanx using first #0 Vicryl followed by #2-0 Vicryl. The skin was closed with skin staples.  The patient tolerated the procedure well and was transported to the recovery room in stable condition.   Marciano Sequin., M.D.

## 2015-04-08 NOTE — Anesthesia Preprocedure Evaluation (Addendum)
Anesthesia Evaluation  Patient identified by MRN, date of birth, ID band Patient awake    Reviewed: Allergy & Precautions, H&P , NPO status , Patient's Chart, lab work & pertinent test results  History of Anesthesia Complications Negative for: history of anesthetic complications  Airway Mallampati: III  TM Distance: >3 FB Neck ROM: limited    Dental  (+) Poor Dentition, Chipped   Pulmonary neg pulmonary ROS, neg shortness of breath,    Pulmonary exam normal breath sounds clear to auscultation       Cardiovascular Exercise Tolerance: Poor hypertension, (-) angina(-) Past MI and (-) DOE Normal cardiovascular exam Rhythm:regular Rate:Normal     Neuro/Psych PSYCHIATRIC DISORDERS negative neurological ROS     GI/Hepatic negative GI ROS, Neg liver ROS, neg GERD  ,  Endo/Other  Hypothyroidism   Renal/GU negative Renal ROS  negative genitourinary   Musculoskeletal   Abdominal   Peds  Hematology negative hematology ROS (+)   Anesthesia Other Findings Past Medical History:   Breast cancer ([place])                             2001           Comment:radiation   Hypertension                                                 Hypothyroidism                                               Dementia                                                    Past Surgical History:   BREAST EXCISIONAL BIOPSY                        Left 2001           Comment:positive   Left hip hemiarthroplasty                        2013        BMI    Body Mass Index   23.29 kg/m 2      Reproductive/Obstetrics negative OB ROS                            Anesthesia Physical Anesthesia Plan  ASA: III  Anesthesia Plan: Spinal   Post-op Pain Management:    Induction:   Airway Management Planned:   Additional Equipment:   Intra-op Plan:   Post-operative Plan:   Informed Consent: I have reviewed the patients History and  Physical, chart, labs and discussed the procedure including the risks, benefits and alternatives for the proposed anesthesia with the patient or authorized representative who has indicated his/her understanding and acceptance.   Dental Advisory Given  Plan Discussed with: Anesthesiologist, CRNA and Surgeon  Anesthesia Plan Comments: (Husband consented over the phone)  Anesthesia Quick Evaluation  

## 2015-04-08 NOTE — Transfer of Care (Signed)
Immediate Anesthesia Transfer of Care Note  Patient: Anita Powell  Procedure(s) Performed: Procedure(s): ARTHROPLASTY BIPOLAR HIP (HEMIARTHROPLASTY) (Right)  Patient Location: PACU  Anesthesia Type:Spinal  Level of Consciousness: sedated  Airway & Oxygen Therapy: Patient Spontanous Breathing and Patient connected to face mask oxygen  Post-op Assessment: Report given to RN and Post -op Vital signs reviewed and stable  Post vital signs: Reviewed and stable  Last Vitals: 11:29 98.2 temp 52 hr 100% sat 100/53 18resp  Filed Vitals:   04/07/15 2004 04/08/15 0401  BP: 175/78 146/72  Pulse: 69 72  Temp:  36.5 C  Resp: 19 18    Complications: No apparent anesthesia complications

## 2015-04-08 NOTE — Clinical Social Work Note (Signed)
Clinical Social Work Assessment  Patient Details  Name: Anita Powell MRN: [id number] Date of Birth: May 26, 1926  Date of referral:  04/08/15               Reason for consult:  Facility Placement                Permission sought to share information with:    Permission granted to share information::     Name::        Agency::     Relationship::     Contact Information:     Housing/Transportation Living arrangements for the past 2 months:  Single Family Home Source of Information:  Patient, Medical Team Patient Interpreter Needed:  None Criminal Activity/Legal Involvement Pertinent to Current Situation/Hospitalization:    Significant Relationships:  Adult Children, Spouse Lives with:  Spouse Do you feel safe going back to the place where you live?  Yes Need for family participation in patient care:  No (Coment)  Care giving concerns:  None at this time   Facilities manager / plan:  Holiday representative (CSW) consult for possible SNF placement, PT is pending at this time.  Patient was pleasant, alert, oriented and engaged in conversation with CSW.  CSW introduced self and explained role of CSW department.    Patient currently lives with her 77 year old husband.  Patient states her age, name and birthday says she has been married for many years, "after 87 I stopped counting".  Patient states she has a son and daughter that live local.   CSW explained that PT will come to evaluate her to determine if she will need rehab. Prior to returning home.  Patient states she has been to SNF for rehab many years ago and does not want to go.  "I would rather go home if possible.   CSW reviewed SNF process with patient and provided her a SNF list.  Patient is agreeable to look at the SNF list but is not agreeable for CSW to start a bed search.   CSW explained that in order for Medicare  to pay for rehab patient will need a 3 night qualifying inpatient stay.  CSW will continue to follow  patient and return to discuss discharge plans if PT recommends SNF for STR.   Employment status:  Retired Forensic scientist:  Medicare PT Recommendations:  Not assessed at this time (pending at this time) Information / Referral to community resources:   (none at this time)  Patient/Family's Response to care:  Patient was appreciative of information provided by CSW and will review list and discuss with her son and daughter  Patient/Family's Understanding of and Emotional Response to Diagnosis, Current Treatment, and Prognosis:  Patient understands that she is under continued medical work up at this time.  Once medically stable she will discharge home with PT or to SNF for STR.  Emotional Assessment Appearance:  Appears stated age Attitude/Demeanor/Rapport:    Affect (typically observed):  Accepting Orientation:  Oriented to Self, Oriented to Place, Oriented to  Time, Oriented to Situation Alcohol / Substance use:    Psych involvement (Current and /or in the community):  No (Comment)  Discharge Needs  Concerns to be addressed:  Care Coordination, Discharge Planning Concerns Readmission within the last 30 days:  No Current discharge risk:  Dependent with Mobility, Chronically ill, Lack of support system Barriers to Discharge:  Continued Medical Work up   Maurine Cane, LCSW 04/08/2015, 4:29 PM

## 2015-04-08 NOTE — Progress Notes (Signed)
Initial Nutrition Assessment     INTERVENTION:  Meals and snacks: Await diet progression once surgery completed. Recommend regular diet or dysphagia 3 if trouble chewing.   Medical Nutrition Supplement Therapy: May need to add supplement if unable to meet nutritional needs   NUTRITION DIAGNOSIS:   Inadequate oral intake related to acute illness as evidenced by NPO status.    GOAL:   Patient will meet greater than or equal to 90% of their needs    MONITOR:    (Energy intake)  REASON FOR ASSESSMENT:   Consult Hip fracture protocol  ASSESSMENT:      Pt admitted following fall with right hip fracture.  Planning surgery this am and not in room during rounds  Past Medical History  Diagnosis Date  . Breast cancer ([place]) 2001    radiation  . Hypertension   . Hypothyroidism   . Dementia     Current Nutrition: NPO  Food/Nutrition-Related History: unable to determine at this time as pt out of room   Scheduled Medications:  . cholecalciferol  2,000 Units Oral QHS  . levothyroxine  137 mcg Oral QAC breakfast  . memantine  10 mg Oral BID  . metoprolol succinate  50 mg Oral Daily  . pantoprazole  40 mg Oral BID  . senna-docusate  1 tablet Oral BID       Electrolyte/Renal Profile and Glucose Profile:   Recent Labs Lab 04/07/15 1705 04/08/15 0440  NA 142 139  K 3.4* 3.5  CL 104 102  CO2 27 27  BUN 11 8  CREATININE 0.70 0.67  CALCIUM 9.5 9.1  GLUCOSE 111* 143*    Gastrointestinal Profile: Last BM:2/4   Nutrition-Focused Physical Exam Findings:  Unable to complete Nutrition-Focused physical exam at this time.     Weight Change: unable to determine at this time No wt encounters to review    Diet Order:  Diet NPO time specified Except for: Sips with Meds  Skin:   reviewed   Height:   Ht Readings from Last 1 Encounters:  04/07/15 5\' 5"  (1.651 m)    Weight:   Wt Readings from Last 1 Encounters:  04/07/15 140 lb (63.504 kg)    Ideal  Body Weight:     BMI:  Body mass index is 23.3 kg/(m^2).  Estimated Nutritional Needs:   Kcal:  BEE 1080 kcals (IF 1.1 1.2, AF 1.2) 1425-1555 kcals/d.   Protein:  (1.0-1.2 g/kg)64-77 g/d  Fluid:  (25-18ml/kg) 1600-1964ml/d  EDUCATION NEEDS:   No education needs identified at this time  [place]. Zenia Resides, [place], [place] (pager) Weekend/On-Call pager [phone])

## 2015-04-09 LAB — BASIC METABOLIC PANEL
ANION GAP: 8 (ref 5–15)
BUN: 8 mg/dL (ref 6–20)
CALCIUM: 8.6 mg/dL — AB (ref 8.9–10.3)
CO2: 28 mmol/L (ref 22–32)
CREATININE: 0.67 mg/dL (ref 0.44–1.00)
Chloride: 105 mmol/L (ref 101–111)
Glucose, Bld: 138 mg/dL — ABNORMAL HIGH (ref 65–99)
Potassium: 2.5 mmol/L — CL (ref 3.5–5.1)
SODIUM: 141 mmol/L (ref 135–145)

## 2015-04-09 LAB — CBC
HEMATOCRIT: 41.4 % (ref 35.0–47.0)
Hemoglobin: 13.9 g/dL (ref 12.0–16.0)
MCH: 30.8 pg (ref 26.0–34.0)
MCHC: 33.6 g/dL (ref 32.0–36.0)
MCV: 91.8 fL (ref 80.0–100.0)
PLATELETS: 159 10*3/uL (ref 150–440)
RBC: 4.51 MIL/uL (ref 3.80–5.20)
RDW: 14.2 % (ref 11.5–14.5)
WBC: 7.6 10*3/uL (ref 3.6–11.0)

## 2015-04-09 LAB — MAGNESIUM: MAGNESIUM: 1.7 mg/dL (ref 1.7–2.4)

## 2015-04-09 MED ORDER — POTASSIUM CHLORIDE CRYS ER 20 MEQ PO TBCR
40.0000 meq | EXTENDED_RELEASE_TABLET | Freq: Once | ORAL | Status: AC
  Administered 2015-04-09: 40 meq via ORAL
  Filled 2015-04-09: qty 2

## 2015-04-09 MED ORDER — LORAZEPAM 1 MG PO TABS: 1.0000 mg | ORAL_TABLET | Freq: Four times a day (QID) | ORAL | Status: DC | PRN

## 2015-04-09 MED ORDER — MAGNESIUM SULFATE 2 GM/50ML IV SOLN
2.0000 g | Freq: Once | INTRAVENOUS | Status: AC
  Administered 2015-04-09: 2 g via INTRAVENOUS
  Filled 2015-04-09: qty 50

## 2015-04-09 MED ORDER — LORAZEPAM 2 MG/ML IJ SOLN: 1.0000 mg | Freq: Four times a day (QID) | INTRAMUSCULAR | Status: DC | PRN

## 2015-04-09 MED ORDER — ADULT MULTIVITAMIN W/MINERALS CH
1.0000 | ORAL_TABLET | Freq: Every day | ORAL | Status: DC
  Administered 2015-04-09 – 2015-04-11 (×3): 1 via ORAL
  Filled 2015-04-09 (×3): qty 1

## 2015-04-09 MED ORDER — QUETIAPINE FUMARATE 25 MG PO TABS
25.0000 mg | ORAL_TABLET | Freq: Every day | ORAL | Status: DC
  Administered 2015-04-09 – 2015-04-10 (×2): 25 mg via ORAL
  Filled 2015-04-09 (×2): qty 1

## 2015-04-09 MED ORDER — POTASSIUM CHLORIDE 10 MEQ/100ML IV SOLN
10.0000 meq | INTRAVENOUS | Status: AC
  Administered 2015-04-09 (×4): 10 meq via INTRAVENOUS
  Filled 2015-04-09 (×4): qty 100

## 2015-04-09 MED ORDER — THIAMINE HCL 100 MG/ML IJ SOLN: 100.0000 mg | Freq: Every day | INTRAMUSCULAR | Status: DC

## 2015-04-09 MED ORDER — FOLIC ACID 1 MG PO TABS
1.0000 mg | ORAL_TABLET | Freq: Every day | ORAL | Status: DC
  Administered 2015-04-09 – 2015-04-11 (×3): 1 mg via ORAL
  Filled 2015-04-09 (×3): qty 1

## 2015-04-09 MED ORDER — ZIPRASIDONE MESYLATE 20 MG IM SOLR
10.0000 mg | Freq: Two times a day (BID) | INTRAMUSCULAR | Status: DC | PRN
  Administered 2015-04-09: 10 mg via INTRAMUSCULAR
  Filled 2015-04-09 (×2): qty 20

## 2015-04-09 MED ORDER — VITAMIN B-1 100 MG PO TABS
100.0000 mg | ORAL_TABLET | Freq: Every day | ORAL | Status: DC
  Administered 2015-04-09 – 2015-04-11 (×3): 100 mg via ORAL
  Filled 2015-04-09 (×3): qty 1

## 2015-04-09 MED ORDER — HALOPERIDOL 0.5 MG PO TABS
1.0000 mg | ORAL_TABLET | Freq: Four times a day (QID) | ORAL | Status: DC | PRN
  Administered 2015-04-09: 1 mg via ORAL
  Filled 2015-04-09 (×2): qty 2

## 2015-04-09 NOTE — Progress Notes (Signed)
PT Cancellation Note  Patient Details Name: Anita Powell MRN: [id number] DOB: 11-07-1926   Cancelled Treatment:    Reason Eval/Treat Not Completed: Patient not medically ready. Spoke with RN prior to attempted PT evaluation. Nursing did request that we hold her evaluation as the patient was just given medication, as she is very confused (gotten out of bed multiple times, etc). Will check back when patient is appropriate.    Ladell Pier Temitope Flammer 04/09/2015, 10:25 AM

## 2015-04-09 NOTE — Progress Notes (Signed)
Requested from MD sitter due to high fall risk.  Approved.  Notified Charge nurse to request.

## 2015-04-09 NOTE — Progress Notes (Signed)
MD notified of critical K+ of 2.5. Orders received. 78meq of potassium po x 1 and 91meq iv q1hr x 4

## 2015-04-09 NOTE — Consult Note (Signed)
ORTHOPAEDICS PROGRESS NOTE  PATIENT NAME: Anita Powell DOB: 1926/11/17  MRN: [id number]  POD # 1: Right hip hemiarthroplasty for femoral neck fracture   Subjective: Patient is awake and alert but extremely confused. She has pulled out her IV and hemovac drain this AM. She has also attempted to get out of bed. Nursing is trying to make arrangements for a sitter, as patient is an obvious fall risk.  Objective: Vital signs in last 24 hours: Temp:  [95.2 F (35.1 C)-98.5 F (36.9 C)] 98.5 F (36.9 C) (02/05 0755) Pulse Rate:  [52-86] 86 (02/05 0755) Resp:  [18] 18 (02/05 0755) BP: (91-166)/(48-80) 166/80 mmHg (02/05 0755) SpO2:  [90 %-99 %] 96 % (02/05 0755)  Intake/Output from previous day: 02/04 0701 - 02/05 0700 In: 5192.3 [P.O.:1773; I.V.:2869.3; IV Piggyback:550] Out: [id number] [Urine:1550; Drains:270; Blood:50]   Recent Labs  04/07/15 1705 04/08/15 0440 04/09/15 0357  WBC 9.1 9.3 7.6  HGB 16.9* 16.3* 13.9  HCT 50.4* 48.4* 41.4  PLT 191 184 159  K 3.4* 3.5 2.5*  CL 104 102 105  CO2 27 27 28   BUN 11 8 8   CREATININE 0.70 0.67 0.67  GLUCOSE 111* 143* 138*  CALCIUM 9.5 9.1 8.6*  INR 1.07 1.09  --     EXAM General: Awake, alert, but confused Lungs: clear to auscultation Cardiac: normal rate, regular rhythm, normal S1, S2, no murmurs, rubs, clicks or gallops Right lower extremity: Right hip dressing is in place with only scant bloody drainage. No significant swelling or ecchymosis.Gentle range of motion of the right hip is well tolerated. Homan's test is negative. Neurologic: Awake, alert, but confused. Sensory & motor function are intact.  Assessment: Right hip hemiarthroplasty for femoral neck fracture   Active Problems:   Closed right hip fracture (HCC) Hypokalemia Delirium superimposed on dementia  Plan: Potassium supplementation as per Medicine. CIWA protocol initiated (secondary to the patient's history of EtOH intake). Minimize the use of narcotics for  pain control. PT/OT to begin when appropriate (held this AM due to mental status issues) I anticipate the nedd to go to a Skilled nursing facility after hospital stay. DVT Prophylaxis - Lovenox, Foot Pumps and TED hose  James P. Holley Bouche M.D.

## 2015-04-09 NOTE — Progress Notes (Addendum)
Patient ID: Anita Powell, female   DOB: 24-Dec-1926, 82 y.o.   MRN: [id number] [facility] PROGRESS NOTE  Anita Powell [id number] DOB: [birth year] DOA: 04/07/2015 PCP: No primary care provider on file.  HPI/Subjective: Patient seen earlier this morning. I was called because she was trying to get out of bed. When I saw her, she was able to answer some yes or no questions. She offered no complaints. I had to refocus her. She did not sleep last night.  Objective: Filed Vitals:   04/09/15 0755 04/09/15 1454  BP: 166/80 131/57  Pulse: 86 67  Temp: 98.5 F (36.9 C) 98.5 F (36.9 C)  Resp: 18 18    Filed Weights   04/07/15 1538  Weight: 63.504 kg (140 lb)    ROS: Review of Systems  Unable to perform ROS review of systems Limited with confusion. Exam: Physical Exam  HENT:  Nose: No mucosal edema.  Mouth/Throat: No oropharyngeal exudate or posterior oropharyngeal edema.  Eyes: Conjunctivae, EOM and lids are normal. Pupils are equal, round, and reactive to light.  Neck: No JVD present. Carotid bruit is not present. No edema present. No thyroid mass and no thyromegaly present.  Cardiovascular: S1 normal and S2 normal.  Exam reveals no gallop.   No murmur heard. Pulses:      Dorsalis pedis pulses are 2+ on the right side, and 2+ on the left side.  Respiratory: No respiratory distress. She has no wheezes. She has no rhonchi. She has no rales.  GI: Soft. Bowel sounds are normal. There is no tenderness.  Musculoskeletal:       Right ankle: She exhibits no swelling.       Left ankle: She exhibits no swelling.  Lymphadenopathy:    She has no cervical adenopathy.  Neurological: She is alert.  Patient able to move all extremities to command  Skin: Skin is warm. No rash noted. Nails show no clubbing.  Psychiatric: She is agitated.      Data Reviewed: Basic Metabolic Panel:  Recent Labs Lab 04/07/15 1705 04/08/15 0440 04/09/15 0357  NA 142 139 141  K 3.4*  3.5 2.5*  CL 104 102 105  CO2 27 27 28   GLUCOSE 111* 143* 138*  BUN 11 8 8   CREATININE 0.70 0.67 0.67  CALCIUM 9.5 9.1 8.6*  MG  --   --  1.7  CBC:  Recent Labs Lab 04/07/15 1705 04/08/15 0440 04/09/15 0357  WBC 9.1 9.3 7.6  HGB 16.9* 16.3* 13.9  HCT 50.4* 48.4* 41.4  MCV 90.0 92.1 91.8  PLT 191 184 159    Studies: Dg Chest 1 View  04/07/2015  CLINICAL DATA:  Fall.  Preop. EXAM: CHEST 1 VIEW COMPARISON:  11/19/2011 FINDINGS: Upper normal heart size. Clear lungs. Normal vascularity. Surgical clips in the left axilla. No pneumothorax. Osteopenia. No obvious acute bony deformity. IMPRESSION: No active disease. Electronically Signed   By: Marybelle Killings M.D.   On: 04/07/2015 17:02   Dg Hip Port Unilat With Pelvis 1v Right  04/08/2015  CLINICAL DATA:  Post RIGHT hemi arthroplasty EXAM: DG HIP (WITH OR WITHOUT PELVIS) 1V PORT RIGHT COMPARISON:  Acute kyphotic digit FINDINGS: RIGHT hip total arthroplasty. RIGHT hip unipolar arthroplasty. No fracture or dislocation. Calcified fibroid. IMPRESSION: No complication following RIGHT hip arthroplasty. Electronically Signed   By: Suzy Bouchard M.D.   On: 04/08/2015 12:35   Dg Hip Unilat With Pelvis 2-3 Views Right  04/07/2015  CLINICAL DATA:  Fall today, right  hip pain EXAM: DG HIP (WITH OR WITHOUT PELVIS) 2-3V RIGHT COMPARISON:  None. FINDINGS: Four views of the right hip submitted. Diffuse osteopenia. There is displaced fracture of the right femoral neck. Partially visualized left hip prosthesis with anatomic alignment. Calcified fibroid is noted within pelvis. Degenerative changes lower lumbar spine. IMPRESSION: Diffuse osteopenia. Displaced fracture of the right femoral neck. Partially visualized left hip prosthesis with anatomic alignment. Electronically Signed   By: Lahoma Crocker M.D.   On: 04/07/2015 16:57    Scheduled Meds: . cholecalciferol  2,000 Units Oral QHS  . enoxaparin (LOVENOX) injection  30 mg Subcutaneous Q12H  . ferrous sulfate   325 mg Oral BID WC  . folic acid  1 mg Oral Daily  . levothyroxine  137 mcg Oral QAC breakfast  . magnesium sulfate 1 - 4 g bolus IVPB  2 g Intravenous Once  . memantine  10 mg Oral BID  . metoprolol succinate  50 mg Oral Daily  . multivitamin with minerals  1 tablet Oral Daily  . QUEtiapine  25 mg Oral QHS  . senna-docusate  1 tablet Oral BID  . thiamine  100 mg Oral Daily   Or  . thiamine  100 mg Intravenous Daily    Assessment/Plan:  1. Acute delirium. I will give Haldol when necessary. Patient has when necessary IM Geodon. I will give Seroquel 25 mg at night to get her sleeping tonight. Family also mentioned that she drinks 3 shots of scotch at night so I did put on [place]  Protocol. 2. Hypomagnesemia and hypokalemia- potassium and magnesium replacement IV. Also oral potassium supplementation. 3. Essential hypertension- Continuemetoprolol. 4. Hypothyroidism unspecified continue levothyroxine 5. Dementia with behavioral disturbance on Namenda. When necessary medications for acute delirium. 6. Right hip fracture requiring operative repair. Pain control as needed. Likely up to rehabilitation on Tuesday, as long his delirium clears first.  Code Status:  Code Status History    Date Active Date Inactive Code Status Order ID Comments User Context   04/07/2015  8:17 PM 04/08/2015  1:08 PM Full Code [id number]  Anita Mango, MD Inpatient   04/07/2015  5:49 PM 04/07/2015  8:17 PM Full Code [id number]  Anita Leep, MD ED     Family Communication: Family yesterday at bedside Disposition Plan: To rehabilitation on Tuesday if the delirium clears  Consultants:  Orthopedic surgery  Time spent: 25 minutes  [place], [place] [facility]

## 2015-04-09 NOTE — Progress Notes (Signed)
Patients daughter [name] arrived with patients spouse.  Was also concerned about confusion.  Daughter advised that patient drinks 2-3 shots of scotch nightly and the last intake would have been Thursday night.  Informed Dr. Leslye Peer, he is concerned about giving Ativan with her delirum as Ativan can increase delirum.  Will continue to closely monitor.  Patient is now on CIWA protocol.  Nursing supervisor arrived to place IV in patient as patient had pulled out her IV.

## 2015-04-09 NOTE — Progress Notes (Signed)
Patient continues to attempt to get out of bed.  MD ordered Geodon  IM.

## 2015-04-09 NOTE — Progress Notes (Signed)
Called spouse [name] and requested family presence to help reorient patient to increase safety.  He advised they would work on getting down here as soon as possible, though he was unsure when that would be.

## 2015-04-09 NOTE — Anesthesia Postprocedure Evaluation (Signed)
Anesthesia Post Note  Patient: Anita Powell  Procedure(s) Performed: Procedure(s) (LRB): ARTHROPLASTY BIPOLAR HIP (HEMIARTHROPLASTY) (Right)  Patient location during evaluation: Nursing Unit Anesthesia Type: Spinal Level of consciousness: awake and confused Pain management: pain level controlled Vital Signs Assessment: post-procedure vital signs reviewed and stable Respiratory status: spontaneous breathing, nonlabored ventilation, respiratory function stable and patient connected to nasal cannula oxygen Cardiovascular status: blood pressure returned to baseline and stable Postop Assessment: no signs of nausea or vomiting Anesthetic complications: no    Last Vitals:  Filed Vitals:   04/09/15 0440 04/09/15 0755  BP: 131/73 166/80  Pulse: 72 86  Temp: 36.7 C 36.9 C  Resp: 18 18    Last Pain:  Filed Vitals:   04/09/15 0755  PainSc: 0-No pain                 Martha Clan

## 2015-04-09 NOTE — Plan of Care (Signed)
Problem: Pain Management: Goal: Pain level will decrease Outcome: Not Progressing Unable to assess  Problem: Safety: Goal: Ability to remain free from injury will improve Outcome: Not Progressing Patient displaying extreme unsafe behaviors, Sitter 1:1 provided

## 2015-04-09 NOTE — Progress Notes (Signed)
Patient is extremely confused.  Has attempted 4 times to get out of bed within the past half hour.  Extremely high risk for falling.  Unable to reorient.

## 2015-04-09 NOTE — Progress Notes (Signed)
Called supervisor to request safety sitter sw [place] Nails RN  Who responded there is no onr extra to sit with pt she suggested we call in someone and she would called cna to see if someone could come in

## 2015-04-09 NOTE — Progress Notes (Signed)
Patient has rested more comfortably this shift after geodon and haldol.  Will continue CIWA q 6.  Sitter at bedside has been the most beneficial.  Family presence did not have much impact as husband could not hear patient and she is the primary care taker for him as he is in a wheelchair.

## 2015-04-09 NOTE — Progress Notes (Signed)
Pt very confused all shift. Moved from rm 154 to 152 for safety reasons. Potassium critically low at 2.5 po supp and iv supp ordered

## 2015-04-10 ENCOUNTER — Encounter: Payer: Self-pay | Admitting: Orthopedic Surgery

## 2015-04-10 LAB — BASIC METABOLIC PANEL
Anion gap: 7 (ref 5–15)
BUN: 9 mg/dL (ref 6–20)
CHLORIDE: 109 mmol/L (ref 101–111)
CO2: 27 mmol/L (ref 22–32)
Calcium: 8.8 mg/dL — ABNORMAL LOW (ref 8.9–10.3)
Creatinine, Ser: 0.59 mg/dL (ref 0.44–1.00)
GFR calc Af Amer: >60 mL/min (ref >60)
Glucose, Bld: 116 mg/dL — ABNORMAL HIGH (ref 65–99)
POTASSIUM: 4.6 mmol/L (ref 3.5–5.1)
SODIUM: 143 mmol/L (ref 135–145)

## 2015-04-10 LAB — CBC
HCT: 40.7 % (ref 35.0–47.0)
HEMOGLOBIN: 13.4 g/dL (ref 12.0–16.0)
MCH: 30.1 pg (ref 26.0–34.0)
MCHC: 32.9 g/dL (ref 32.0–36.0)
MCV: 91.4 fL (ref 80.0–100.0)
PLATELETS: 163 10*3/uL (ref 150–440)
RBC: 4.45 MIL/uL (ref 3.80–5.20)
RDW: 14.7 % — ABNORMAL HIGH (ref 11.5–14.5)
WBC: 6.1 10*3/uL (ref 3.6–11.0)

## 2015-04-10 LAB — URINE CULTURE

## 2015-04-10 LAB — MAGNESIUM: MAGNESIUM: 2.3 mg/dL (ref 1.7–2.4)

## 2015-04-10 NOTE — Care Management Important Message (Signed)
Important Message  Patient Details  Name: Anita Powell MRN: [id number] Date of Birth: 09-28-1926   Medicare Important Message Given:  Yes    Emilija Bohman A, RN 04/10/2015, 7:27 AM

## 2015-04-10 NOTE — Evaluation (Signed)
Physical Therapy Evaluation Patient Details Name: Anita Powell MRN: [id number] DOB: 02-25-1927 Today's Date: 04/10/2015   History of Present Illness  Pt is an 82 y.o. female with PMH of hypertension, breast cancer, and left hip fracture s/p surgery.  Pt s/p mechanical fall tripping over her cats.  Pt was admitted with right hip fracture and s/p right unipolar arthroplasty 04/08/15.  Pt with post op delirium.    Clinical Impression  Prior to admission pt reports being independent at home; however, pt reports two falls in the last 6 months.  Pt lives with her husband who according to care management note uses a wheelchair.  Currently pt is s/p right unipolar posterior hip arthroplasty and is presenting with post-op delirium.  Pt required moderate assist, extra time, and substantial verbal cues with bed mobility.  Upon rolling in bed, pt was observed to be incontinent and nursing was called for changing.  Following changing, pt required moderate assist, extra time and verbal cues to sit on EOB.  Pt needed moderate assist x2 for standing and ambulation (total of 3 feet) to chair with RW.  Verbal cues and extra time were needed for sit to stand and ambulation d/t cognitive status. Due to aforementioned function and strength deficits, pt is in need of skilled physical therapy.  It is recommended that pt is discharged to short term rehab when medically appropriate.     Follow Up Recommendations SNF    Equipment Recommendations  3in1 (PT);Rolling walker with 5" wheels    Recommendations for Other Services       Precautions / Restrictions Precautions Precautions: Fall;Posterior Hip Precaution Booklet Issued: No (pt displays delirium) Restrictions Weight Bearing Restrictions: Yes RLE Weight Bearing: Weight bearing as tolerated      Mobility  Bed Mobility Overal bed mobility: Needs Assistance Bed Mobility: Rolling;Sidelying to Sit Rolling: Mod assist Sidelying to sit: Mod assist  Verbal  cues for hand and foot placement and extra time needed         Transfers Overall transfer level: Needs assistance Equipment used: Rolling walker (2 wheeled) Transfers: Sit to/from Stand Sit to Stand: Mod assist;+2 physical assistance (RW)  Verbal cues for hand and foot placement and extra time needed          Ambulation/Gait Ambulation/Gait assistance: Mod assist Ambulation Distance (Feet): 3 Feet Assistive device: Rolling walker (2 wheeled) Gait Pattern/deviations: Wide base of support;Shuffle;Decreased step length - right;Decreased step length - left;Step-to pattern;Antalgic Gait velocity: decreased  Verbal cues for hand and foot placement and extra time needed.    Stairs            Wheelchair Mobility    Modified Rankin (Stroke Patients Only)       Balance Overall balance assessment: Needs assistance Sitting-balance support: Single extremity supported (bed rail) Sitting balance-Leahy Scale: Poor (posterior lean intermittently requiring assist for upright posture)     Standing balance support: Bilateral upper extremity supported (RW) Standing balance-Leahy Scale: Poor                               Pertinent Vitals/Pain Pain Assessment: Faces Faces Pain Scale: Hurts little more Pain Location: right hip Pain Intervention(s): Limited activity within patient's tolerance;Monitored during session;Premedicated before session;Repositioned  See flow sheet for vitals.     Home Living Family/patient expects to be discharged to:: Private residence Living Arrangements: Spouse/significant other Available Help at Discharge: Family Type of Home: House Home Access: Stairs  to enter Entrance Stairs-Rails: Can reach both Entrance Stairs-Number of Steps: pt is a poor historian and could not remember how many stairs there were to enter her house. Home Layout: Two level Home Equipment: None      Prior Function Level of Independence: Independent                Hand Dominance        Extremity/Trunk Assessment   Upper Extremity Assessment: Generalized weakness           Lower Extremity Assessment: Generalized weakness      Cervical / Trunk Assessment: Normal  Communication   Communication: No difficulties  Cognition Arousal/Alertness: Awake/alert Behavior During Therapy: Restless Overall Cognitive Status: History of cognitive impairments - at baseline       Memory: Decreased recall of precautions;Decreased short-term memory              General Comments   Nursing was contacted and cleared pt for physical therapy.  Pt was agreeable and tolerated session well.  Sitter present.  Pt educated on posterior THP's.     Exercises Total Joint Exercises Ankle Circles/Pumps: AROM;Both;10 reps Quad Sets: AROM;Right;10 reps Heel Slides: AAROM;Right;10 reps Hip ABduction/ADduction: AAROM;Right;10 reps      Assessment/Plan    PT Assessment Patient needs continued PT services  PT Diagnosis Generalized weakness   PT Problem List Decreased strength;Decreased range of motion;Decreased activity tolerance;Decreased balance;Decreased mobility;Decreased cognition;Decreased knowledge of use of DME;Decreased safety awareness;Pain  PT Treatment Interventions DME instruction;Gait training;Stair training;Functional mobility training;Therapeutic activities;Therapeutic exercise;Patient/family education;Balance training   PT Goals (Current goals can be found in the Care Plan section) Acute Rehab PT Goals Patient Stated Goal: to go home  PT Goal Formulation: With patient Time For Goal Achievement: 04/24/15 Potential to Achieve Goals: Fair    Frequency BID   Barriers to discharge Decreased caregiver support Pt's husband uses a wheelchair per notes    Co-evaluation               End of Session Equipment Utilized During Treatment: Gait belt Activity Tolerance: Patient limited by fatigue Patient left: in chair;with call  bell/phone within reach;with nursing/sitter in room;with SCD's reapplied (towel rolls under heels)           Time: [id number] PT Time Calculation (min) (ACUTE ONLY): 45 min   Charges:         PT G Codes:       Mittie Bodo, SPT Mittie Bodo 04/10/2015, 11:32 AM

## 2015-04-10 NOTE — Progress Notes (Addendum)
Per [facility] admissions coordinator at [place] they can make a bed offer. Clinical Social Worker (CSW) made Anita Powell aware that patient will likely be ready for D/C tomorrow. Per Anita Powell from [facility] to [place] is 40 miles. CSW contacted patient's daughter Anita Powell and made her aware of above. Daughter accepted bed offer. CSW made daughter aware that it may be a high EMS bill due to the distance. CSW contacted [facility] and made them aware of above. CSW is waiting on call back from EMS crew chief. CSW will continue to follow and assist as needed.   Per [facility] EMS crew chief they will not be able to transport patient to [place], [place] contacted [facility] and spoke with Liliane Channel. Per Liliane Channel RN will have to call tomorrow to check PTAR's schedule and patient may need to pay up front. CSW contacted patient's daughter and made her aware of above. Daughter reported that she is at work and will call CSW back. Daughter asked about [facility] EMS. CSW contacted [facility] and spoke with Kennyth Lose. Per Kennyth Lose they will not be able to transport patient from [facility] to [place], [place] will continue to follow and assist as needed.   CSW received a call back from patient's daughter Anita Powell. Daughter understands that transport options to [place] will be [facility] with money up front or family transport. Per daughter she will discuss options with patient's husband this evening. Per daughter family will likely try to transport.   Blima Rich, LCSW [phone]

## 2015-04-10 NOTE — Progress Notes (Signed)
Patient ID: Anita Powell, female   DOB: 04/25/1926, 82 y.o.   MRN: [id number] [facility] Physicians PROGRESS NOTE  Anita Powell [id number] DOB: 1926/10/16 DOA: 04/07/2015 PCP: No primary care provider on file.  HPI/Subjective: As per nursing staff, she did not sleep too well last night again about a few hours. The patient feels okay and no she is in the hospital. Complains of no pain. The patient's answers to some questions are a little bit odd.  Objective: Filed Vitals:   04/10/15 0405 04/10/15 1121  BP: 131/83   Pulse: 65 83  Temp: 97.8 F (36.6 C)   Resp: 20     Filed Weights   04/07/15 1538  Weight: 63.504 kg (140 lb)    ROS: Review of Systems  Unable to perform ROS review of systems Limited with confusion. Exam: Physical Exam  HENT:  Nose: No mucosal edema.  Mouth/Throat: No oropharyngeal exudate or posterior oropharyngeal edema.  Eyes: Conjunctivae, EOM and lids are normal. Pupils are equal, round, and reactive to light.  Neck: No JVD present. Carotid bruit is not present. No edema present. No thyroid mass and no thyromegaly present.  Cardiovascular: S1 normal and S2 normal.  Exam reveals no gallop.   No murmur heard. Pulses:      Dorsalis pedis pulses are 2+ on the right side, and 2+ on the left side.  Respiratory: No respiratory distress. She has no wheezes. She has no rhonchi. She has no rales.  GI: Soft. Bowel sounds are normal. There is no tenderness.  Musculoskeletal:       Right ankle: She exhibits no swelling.       Left ankle: She exhibits no swelling.  Lymphadenopathy:    She has no cervical adenopathy.  Neurological: She is alert.  Patient able to move all extremities to command  Skin: Skin is warm. No rash noted. Nails show no clubbing.  Psychiatric: She is agitated.      Data Reviewed: Basic Metabolic Panel:  Recent Labs Lab 04/07/15 1705 04/08/15 0440 04/09/15 0357 04/10/15 0435  NA 142 139 141 143  K 3.4* 3.5 2.5* 4.6   CL 104 102 105 109  CO2 27 27 28 27   GLUCOSE 111* 143* 138* 116*  BUN 11 8 8 9   CREATININE 0.70 0.67 0.67 0.59  CALCIUM 9.5 9.1 8.6* 8.8*  MG  --   --  1.7 2.3  CBC:  Recent Labs Lab 04/07/15 1705 04/08/15 0440 04/09/15 0357 04/10/15 0435  WBC 9.1 9.3 7.6 6.1  HGB 16.9* 16.3* 13.9 13.4  HCT 50.4* 48.4* 41.4 40.7  MCV 90.0 92.1 91.8 91.4  PLT 191 184 159 163    Studies: No results found.  Scheduled Meds: . cholecalciferol  2,000 Units Oral QHS  . enoxaparin (LOVENOX) injection  30 mg Subcutaneous Q12H  . ferrous sulfate  325 mg Oral BID WC  . folic acid  1 mg Oral Daily  . levothyroxine  137 mcg Oral QAC breakfast  . memantine  10 mg Oral BID  . metoprolol succinate  50 mg Oral Daily  . multivitamin with minerals  1 tablet Oral Daily  . QUEtiapine  25 mg Oral QHS  . senna-docusate  1 tablet Oral BID  . thiamine  100 mg Oral Daily   Or  . thiamine  100 mg Intravenous Daily    Assessment/Plan:  1. Acute delirium. I will give Haldol when necessary. Patient has when necessary IM Geodon. I will give Seroquel 25 mg  at night to get her sleeping tonight. I do not think the patient is withdrawing from alcohol. 2. Hypomagnesemia and hypokalemia- replaced back into the normal range yesterday. 3. Essential hypertension- Continue metoprolol. 4. Hypothyroidism unspecified continue levothyroxine 5. Dementia with behavioral disturbance on Namenda. When necessary medications for acute delirium. 6. Right hip fracture requiring operative repair. Pain control as needed. Potentially to rehabilitation on Tuesday or Wednesday, as long his delirium clears first.  Code Status:  Code Status History    Date Active Date Inactive Code Status Order ID Comments User Context   04/07/2015  8:17 PM 04/08/2015  1:08 PM Full Code [id number]  Nicholes Mango, MD Inpatient   04/07/2015  5:49 PM 04/07/2015  8:17 PM Full Code [id number]  Dereck Leep, MD ED     Family Communication: Daughter on the  phone Disposition Plan: To rehabilitation on Tuesday or Wednesday if the delirium clears  Consultants:  Orthopedic surgery  Time spent: 25 minutes  [place] [place] [facility]

## 2015-04-10 NOTE — Progress Notes (Signed)
Requested PT to come work with patient for afternoon session as she is adamant about getting back to bed.  Sitter to leave at 1400.

## 2015-04-10 NOTE — Progress Notes (Signed)
Patient Name Sex DOB SSN    Anita Powell,Anita Powell Female        Progress Notes by Watt Climes, PA at 04/10/2015 6:58 AM    Author: Watt Climes, PA Service: Orthopedics Author Type: Physician Assistant   Filed: 04/10/2015 7:04 AM Note Time: 04/10/2015 6:58 AM Status: Signed   Editor: Watt Climes, PA (Physician Assistant)     Expand All Collapse All     Subjective: 1 Day Post-Op Procedure(s) (LRB): INTRAMEDULLARY (IM) NAIL INTERTROCHANTRIC (Right) Patient reports pain as 0 on 0-10 scale.  Patient is well, and has had no acute complaints or problems We will start therapy today.  Plan is to go Rehab after hospital stay. no nausea and no vomiting Patient denies any chest pains or shortness of breath. Resting well. Having a hard time keeping eyes open  Objective: Vital signs in last 24 hours: Temp: [97.4 F (36.3 C)-98 F (36.7 C)] 97.6 F (36.4 C) (02/06 0404) Pulse Rate: [50-70] 57 (02/06 0404) Resp: [8-23] 20 (02/06 0404) BP: (132-210)/(47-93) 136/54 mmHg (02/06 0404) SpO2: [91 %-100 %] 96 % (02/06 0404) Weight: [54.432 kg (120 lb)] 54.432 kg (120 lb) (02/05 1013) well approximated incision; A lot of localized brusing  Heels are non tender and elevated off the bed using rolled towels Intake/Output from previous day: 02/05 0701 - 02/06 0700 In: 1400 [I.V.:1400] Out: 1400 [Urine:1200; Blood:200] Intake/Output this shift: Total I/O In: -  Out: 750 [Urine:750]   Recent Labs (last 2 labs)      Recent Labs  04/09/15 1019 04/10/15 0431  HGB 11.1* 8.0*      Recent Labs (last 2 labs)      Recent Labs  04/09/15 1019 04/10/15 0431  WBC 8.3 8.6  RBC 3.81 2.69*  HCT 33.8* 23.6*  PLT 179 122*      Recent Labs (last 2 labs)      Recent Labs  04/09/15 1019 04/10/15 0431  NA 134* 134*  K 4.4 4.4  CL 102 104  CO2 23 18*  BUN 22* 22*  CREATININE 1.38* 1.18*  GLUCOSE 117* 152*  CALCIUM 9.2 8.2*       Recent Labs (last 2 labs)      Recent Labs  04/09/15 1019  INR 1.09      EXAM General - Patient is Alert, Appropriate and Oriented Extremity - Neurologically intact Neurovascular intact Sensation intact distally Intact pulses distally Dorsiflexion/Plantar flexion intact Dressing - scant drainage Motor Function - intact, moving foot and toes well on exam. Able to raise legs on own  Past Medical History  Diagnosis Date  . Essential hypertension 2006  . Cataract 2012    bilateral  . GERD (gastroesophageal reflux disease)   . Lump or mass in breast 2011  . Special screening for malignant neoplasms, colon   . Syncope and collapse   . Cancer [facility]) 2011    left breast  . Malignant neoplasm of upper-outer quadrant of female breast ([place]) 2011  . Skin cancer   . PAF (paroxysmal atrial fibrillation) ([place])     a. Dx 09/2013 following hip surgery; b. CHA2DS2VASc = 4-->amio/eliquis (2.5 bid).  . Moderate tricuspid insufficiency     a. 10/2013 Echo: EF 55-60%, no rwma, mild to mod AI, mild MR, mod dil LA/RA, mod TR, PASP 15mmHg.  Marland Kitchen Aortic insufficiency     a. 10/2013 Echo: mild to mod.    Assessment/Plan: 1 Day Post-Op Procedure(s) (LRB): INTRAMEDULLARY (IM) NAIL INTERTROCHANTRIC (Right) Principal Problem:  Hip fracture ([place])  Estimated body mass index is 20.59 kg/(m^2) as calculated from the following:  Height as of this encounter: 5\' 4"  (1.626 m).  Weight as of this encounter: 54.432 kg (120 lb). Advance diet Up with therapy D/C IV fluids Discharge to SNF once medically cleared  Labs: reviewed DVT Prophylaxis - Lovenox, TED hose and leg compression wraps Weight-Bearing as tolerated to right leg D/C O2 and Pulse OX and try on Room Air Labs F/u in [place] clinic in 6 weeks Staples to be removed at 2 weeks post op Continue Lovenox 40 qd for 2 weeks once discharged Change dressing as needed  Wille Glaser R. McKenzie Hooppole 04/10/2015, 6:58 AM

## 2015-04-10 NOTE — Progress Notes (Signed)
PT is recommending SNF. CSW attempted to meet with patient twice however PT was in the room and patient was asleep the second time. Per RN patient's sitter is being discharged at 2 pm today. Clinical Education officer, museum (CSW) contacted patient's daughter [name]. Per daughter patient has been to [facility] in [place], [place] several years ago. Daughter prefers [facility]. CSW explained to daughter that a referral can be sent to [facility] and encouraged her to pick a second choice in [place] or [place]. Daughter verbalized her understanding.  CSW left [facility] admissions coordinator a voicemail and sent SNF referral. CSW also presented [facility] bed offers to daughter and left a list of bed offers in the room. Per daughter she is coming to [facility] this afternoon and will review offers. CSW will continue to follow and assist as needed.   Blima Rich, LCSW [phone]

## 2015-04-10 NOTE — NC FL2 (Signed)
[place] LEVEL OF CARE SCREENING TOOL     IDENTIFICATION  Patient Name: Anita Powell Birthdate: [birth year] Sex: female Admission Date (Current Location): 04/07/2015  [place] and [place] Number:  Manufacturing engineer and Address:  [facility], [address] [place], [place] [address]      Provider Number: [id number]  Attending Physician Name and Address:  Loletha Grayer, MD  Relative Name and Phone Number:       Current Level of Care: Hospital Recommended Level of Care: [place] Prior Approval Number:    Date Approved/Denied:   PASRR Number:  ( [id number] )  Discharge Plan: SNF    Current Diagnoses: Patient Active Problem List   Diagnosis Date Noted  . Closed right hip fracture ([place]) 04/07/2015   . Breast cancer ([place]) 2001    radiation  . Hypertension    Dementia, hypothyroidism       Orientation RESPIRATION BLADDER Height & Weight     Self  Normal Incontinent Weight: 140 lb (63.504 kg) Height:  5\' 5"  (165.1 cm)  BEHAVIORAL SYMPTOMS/MOOD NEUROLOGICAL BOWEL NUTRITION STATUS   (none)  (none ) Incontinent Diet (Regular Diet )  AMBULATORY STATUS COMMUNICATION OF NEEDS Skin   Extensive Assist Verbally Surgical wounds (Incision: Right Hip. )                       Personal Care Assistance Level of Assistance  Bathing, Feeding, Dressing Bathing Assistance: Limited assistance Feeding assistance: Independent Dressing Assistance: Limited assistance     Functional Limitations Info  Sight, Hearing, Speech Sight Info: Adequate Hearing Info: Adequate Speech Info: Adequate    SPECIAL CARE FACTORS FREQUENCY  PT (By licensed PT), OT (By licensed OT)     PT Frequency:  (5) OT Frequency:  (5)            Contractures      Additional Factors Info  Psychotropic               Current Medications (04/10/2015):  This is the current hospital active medication list Current  Facility-Administered Medications  Medication Dose Route Frequency Provider Last Rate Last Dose  . acetaminophen (TYLENOL) tablet 650 mg  650 mg Oral Q6H PRN Dereck Leep, MD   650 mg at 04/10/15 0941   Or  . acetaminophen (TYLENOL) suppository 650 mg  650 mg Rectal Q6H PRN Dereck Leep, MD      . bisacodyl (DULCOLAX) suppository 10 mg  10 mg Rectal Daily PRN Dereck Leep, MD      . cholecalciferol (VITAMIN D) tablet 2,000 Units  2,000 Units Oral QHS Nicholes Mango, MD   2,000 Units at 04/09/15 2153  . enoxaparin (LOVENOX) injection 30 mg  30 mg Subcutaneous Q12H Dereck Leep, MD   30 mg at 04/10/15 0939  . ferrous sulfate tablet 325 mg  325 mg Oral BID WC Dereck Leep, MD   325 mg at 04/10/15 [id number]  . folic acid (FOLVITE) tablet 1 mg  1 mg Oral Daily Loletha Grayer, MD   1 mg at 04/10/15 0939  . haloperidol (HALDOL) tablet 1 mg  1 mg Oral Q6H PRN Loletha Grayer, MD   1 mg at 04/09/15 [id number]  . levothyroxine (SYNTHROID, LEVOTHROID) tablet 137 mcg  137 mcg Oral QAC breakfast Nicholes Mango, MD   137 mcg at 04/10/15 [id number]  . LORazepam (ATIVAN) tablet 1 mg  1 mg Oral Q6H PRN Loletha Grayer,  MD       Or  . LORazepam (ATIVAN) injection 1 mg  1 mg Intravenous Q6H PRN Loletha Grayer, MD      . magnesium hydroxide (MILK OF MAGNESIA) suspension 30 mL  30 mL Oral Daily PRN Dereck Leep, MD      . memantine [facility]) tablet 10 mg  10 mg Oral BID Nicholes Mango, MD   10 mg at 04/10/15 [id number]  . menthol-cetylpyridinium (CEPACOL) lozenge 3 mg  1 lozenge Oral PRN Dereck Leep, MD       Or  . phenol (CHLORASEPTIC) mouth spray 1 spray  1 spray Mouth/Throat PRN Dereck Leep, MD      . metoprolol (LOPRESSOR) injection 5 mg  5 mg Intravenous Q4H PRN Nicholes Mango, MD      . metoprolol succinate (TOPROL-XL) 24 hr tablet 50 mg  50 mg Oral Daily Nicholes Mango, MD   50 mg at 04/10/15 [id number]  . multivitamin with minerals tablet 1 tablet  1 tablet Oral Daily Loletha Grayer, MD   1 tablet at 04/10/15 [phone]  . ondansetron  (ZOFRAN) tablet 4 mg  4 mg Oral Q6H PRN Dereck Leep, MD       Or  . ondansetron (ZOFRAN) injection 4 mg  4 mg Intravenous Q6H PRN Dereck Leep, MD      . oxyCODONE (Oxy IR/ROXICODONE) immediate release tablet 5-10 mg  5-10 mg Oral Q4H PRN Dereck Leep, MD      . QUEtiapine (SEROQUEL) tablet 25 mg  25 mg Oral QHS Loletha Grayer, MD   25 mg at 04/09/15 2153  . senna-docusate (Senokot-S) tablet 1 tablet  1 tablet Oral BID Dereck Leep, MD   1 tablet at 04/09/15 2153  . sodium phosphate (FLEET) 7-19 GM/118ML enema 1 enema  1 enema Rectal Once PRN Dereck Leep, MD      . thiamine (VITAMIN B-1) tablet 100 mg  100 mg Oral Daily Loletha Grayer, MD   100 mg at 04/10/15 [id number]   Or  . thiamine (B-1) injection 100 mg  100 mg Intravenous Daily Loletha Grayer, MD      . ziprasidone (GEODON) injection 10 mg  10 mg Intramuscular Q12H PRN Loletha Grayer, MD   10 mg at 04/09/15 1007     Discharge Medications: Please see discharge summary for a list of discharge medications.  Relevant Imaging Results:  Relevant Lab Results:   Additional Information  (SSN: [id number])  Loralyn Freshwater, LCSW

## 2015-04-10 NOTE — Care Management Note (Signed)
Case Management Note  Patient Details  Name: Anita Powell MRN: [id number] Date of Birth: 1926-10-21  Subjective/Objective:        82yo Anita Anita Powell received a surgical repair of her right hip fracture by Dr Marry Guan on 04/08/15.  Anita Powell is confused this morning, and has an additional diagnosis of acute delirium. She is on CIWA protocol. This Probation officer attempted to call Anita Powell (cell phone was full and unable to accept a message) and left a message requesting a call back on the phone of her son.  Case manager plans to discuss discharge planning with family since Anita Powell is unable to participate in discharge planning at this time. Case management will follow for discharge planning.            Action/Plan:   Expected Discharge Date:                  Expected Discharge Plan:     In-House Referral:     Discharge planning Services     Post Acute Care Choice:    Choice offered to:     DME Arranged:    DME Agency:     HH Arranged:    [place] Agency:     Status of Service:     Medicare Important Message Given:  Yes Date Medicare IM Given:    Medicare IM give by:    Date Additional Medicare IM Given:    Additional Medicare Important Message give by:     If discussed at [place] of Stay Meetings, dates discussed:    Additional Comments:  Anita Powell A, RN 04/10/2015, 11:57 AM

## 2015-04-10 NOTE — Clinical Social Work Placement (Signed)
   CLINICAL SOCIAL WORK PLACEMENT  NOTE  Date:  04/10/2015  Patient Details  Name: NIZHONI TRUONG MRN: [id number] Date of Birth: 10/28/1926  Clinical Social Work is seeking post-discharge placement for this patient at the [place] level of care (*CSW will initial, date and re-position this form in  chart as items are completed):  Yes   Patient/family provided with [facility] Work Department's list of facilities offering this level of care within the geographic area requested by the patient (or if unable, by the patient's family).  Yes   Patient/family informed of their freedom to choose among providers that offer the needed level of care, that participate in Medicare, Medicaid or managed care program needed by the patient, have an available bed and are willing to accept the patient.  Yes   Patient/family informed of [facility]'s ownership interest in [facility] and [facility], as well as of the fact that they are under no obligation to receive care at these facilities.  PASRR submitted to EDS on       PASRR number received on       Existing PASRR number confirmed on 04/10/15     FL2 transmitted to all facilities in geographic area requested by pt/family on 04/10/15     FL2 transmitted to all facilities within larger geographic area on 04/10/15     Patient informed that his/her managed care company has contracts with or will negotiate with certain facilities, including the following:        Yes   Patient/family informed of bed offers received.  Patient chooses bed at  ([facility], [place]) )     Physician recommends and patient chooses bed at      Patient to be transferred to   on  .  Patient to be transferred to facility by       Patient family notified on   of transfer.  Name of family member notified:        PHYSICIAN       Additional Comment:    _______________________________________________ Loralyn Freshwater,  LCSW 04/10/2015, 2:22 PM

## 2015-04-10 NOTE — Progress Notes (Signed)
Physical Therapy Treatment Patient Details Name: Anita Powell MRN: [id number] DOB: 1926-08-07 Today's Date: 04/10/2015    History of Present Illness Pt is an 82 y.o. female with PMH of hypertension, breast cancer, and left hip fracture s/p surgery.  Pt s/p mechanical fall tripping over her cats.  Pt was admitted with right hip fracture and s/p right unipolar arthroplasty 04/08/15.  Pt with post op delirium.    PT Comments    PT was called back to see pt early due to pt being restless and wanting to get out of chair back to bed.  Pt ambulated 10 feet with moderate assist x2 with RW.  Pt required substantial verbal and tactile cues for foot and hand placement for transfers and ambulation.  Pt did not remember PT nor posterior hip precautions (from AM session).  After ambulation, pt needed moderate assist to control descent sitting and then go sit to supine.     Follow Up Recommendations  SNF     Equipment Recommendations  3in1 (PT);Rolling walker with 5" wheels    Recommendations for Other Services       Precautions / Restrictions Precautions Precautions: Fall;Posterior Hip Precaution Booklet Issued: No (acute dellirium) Restrictions Weight Bearing Restrictions: Yes RLE Weight Bearing: Weight bearing as tolerated    Mobility  Bed Mobility Overal bed mobility: Needs Assistance Bed Mobility: Rolling;Sit to Sidelying Rolling: Mod assist     Sit to sidelying to supine: Mod assist    Transfers Overall transfer level: Needs assistance Equipment used: Rolling walker (2 wheeled) Transfers: Sit to/from Stand Sit to Stand: Mod assist;+2 physical assistance            Ambulation/Gait Ambulation/Gait assistance: Mod assist (verbal and tactile cues for foot and hand placement ) Ambulation Distance (Feet): 10 Feet Assistive device: Rolling walker (2 wheeled) Gait Pattern/deviations: Wide base of support;Decreased step length - right;Decreased step length -  left;Shuffle;Antalgic Gait velocity: decreased       Stairs            Wheelchair Mobility    Modified Rankin (Stroke Patients Only)       Balance Overall balance assessment: Needs assistance Sitting-balance support: Single extremity supported Sitting balance-Leahy Scale: Poor     Standing balance support: Bilateral upper extremity supported Standing balance-Leahy Scale: Poor                      Cognition Arousal/Alertness: Awake/alert Behavior During Therapy: Restless Overall Cognitive Status: No family/caregiver present to determine baseline cognitive functioning       Memory: Decreased recall of precautions;Decreased short-term memory              Exercises    General Comments   Nursing was contacted and cleared pt for physical therapy.  Pt was agreeable and tolerated session well.       Pertinent Vitals/Pain No/denies pain. See flow sheet for vitals.     Home Living Family/patient expects to be discharged to:: Private residence Living Arrangements: Spouse/significant other Available Help at Discharge: Family Type of Home: House Home Access: Stairs to enter Entrance Stairs-Rails: Can reach both Home Layout: Two level Home Equipment: None      Prior Function Level of Independence: Independent           PT Goals (current goals can now be found in the care plan section) Acute Rehab PT Goals Patient Stated Goal: to go home  PT Goal Formulation: With patient Time For Goal Achievement: 04/24/15 Potential  to Achieve Goals: Fair Progress towards PT goals: Progressing toward goals    Frequency  BID    PT Plan Current plan remains appropriate    Co-evaluation             End of Session Equipment Utilized During Treatment: Gait belt Activity Tolerance: Patient limited by fatigue Patient left: in bed;with call bell/phone within reach;with bed alarm set;with nursing/sitter in room;with SCD's reapplied     Time:  [id number] PT Time Calculation (min) (ACUTE ONLY): 21 min  Charges:                      G Codes:      Mittie Bodo, SPT Mittie Bodo 04/10/2015, 3:04 PM

## 2015-04-11 LAB — CBC
HEMATOCRIT: 41.4 % (ref 35.0–47.0)
Hemoglobin: 13.8 g/dL (ref 12.0–16.0)
MCH: 30.2 pg (ref 26.0–34.0)
MCHC: 33.3 g/dL (ref 32.0–36.0)
MCV: 90.6 fL (ref 80.0–100.0)
PLATELETS: 164 10*3/uL (ref 150–440)
RBC: 4.57 MIL/uL (ref 3.80–5.20)
RDW: 14.7 % — ABNORMAL HIGH (ref 11.5–14.5)
WBC: 6 10*3/uL (ref 3.6–11.0)

## 2015-04-11 LAB — BASIC METABOLIC PANEL
ANION GAP: 8 (ref 5–15)
BUN: 17 mg/dL (ref 6–20)
CO2: 25 mmol/L (ref 22–32)
Calcium: 8.5 mg/dL — ABNORMAL LOW (ref 8.9–10.3)
Chloride: 106 mmol/L (ref 101–111)
Creatinine, Ser: 0.55 mg/dL (ref 0.44–1.00)
GFR calc Af Amer: >60 mL/min (ref >60)
GLUCOSE: 99 mg/dL (ref 65–99)
POTASSIUM: 3.8 mmol/L (ref 3.5–5.1)
Sodium: 139 mmol/L (ref 135–145)

## 2015-04-11 MED ORDER — QUETIAPINE FUMARATE 25 MG PO TABS: 25.0000 mg | ORAL_TABLET | Freq: Every day | ORAL | Status: DC

## 2015-04-11 MED ORDER — ENOXAPARIN SODIUM 30 MG/0.3ML ~~LOC~~ SOLN: 30.0000 mg | SUBCUTANEOUS | Status: DC

## 2015-04-11 MED ORDER — ENOXAPARIN SODIUM 40 MG/0.4ML ~~LOC~~ SOLN: 40.0000 mg | SUBCUTANEOUS | Status: DC

## 2015-04-11 MED ORDER — ACETAMINOPHEN 325 MG PO TABS: 650.0000 mg | ORAL_TABLET | Freq: Four times a day (QID) | ORAL | Status: DC | PRN

## 2015-04-11 NOTE — Progress Notes (Signed)
   Subjective: 3 Days Post-Op Procedure(s) (LRB): ARTHROPLASTY BIPOLAR HIP (HEMIARTHROPLASTY) (Right) Patient reports pain as 0 on 0-10 scale.   Patient is well, and has had no acute complaints or problems Continue with physical therapy today.  Plan is to go Rehab after hospital stay. no nausea and no vomiting Patient denies any chest pains or shortness of breath. Objective: Vital signs in last 24 hours: Temp:  [97.3 F (36.3 C)-98.3 F (36.8 C)] 98.2 F (36.8 C) (02/07 0459) Pulse Rate:  [59-83] 67 (02/07 0459) Resp:  [17-18] 18 (02/07 0459) BP: (122-157)/(65-76) 157/65 mmHg (02/07 0459) SpO2:  [92 %-96 %] 95 % (02/07 0459) well approximated incision Heels are non tender and elevated off the bed using rolled towels Intake/Output from previous day: 02/06 0701 - 02/07 0700 In: 480 [P.O.:480] Out: -  Intake/Output this shift:     Recent Labs  04/09/15 0357 04/10/15 0435 04/11/15 0551  HGB 13.9 13.4 13.8    Recent Labs  04/10/15 0435 04/11/15 0551  WBC 6.1 6.0  RBC 4.45 4.57  HCT 40.7 41.4  PLT 163 164    Recent Labs  04/10/15 0435 04/11/15 0551  NA 143 139  K 4.6 3.8  CL 109 106  CO2 27 25  BUN 9 17  CREATININE 0.59 0.55  GLUCOSE 116* 99  CALCIUM 8.8* 8.5*   No results for input(s): LABPT, INR in the last 72 hours.  EXAM General - Patient is Alert, Confused and Delusional. patient states that she is on "[place]". When asked why [place], she states that she has never been there. Extremity - Neurologically intact Neurovascular intact Sensation intact distally Intact pulses distally Dorsiflexion/Plantar flexion intact Dressing - dressing C/D/I Motor Function - intact, moving foot and toes well on exam. Moving legs very well without any apparent discomfort.  Past Medical History  Diagnosis Date  . Breast cancer ([place]) 2001    radiation  . Hypertension   . Hypothyroidism   . Dementia     Assessment/Plan: 3 Days Post-Op Procedure(s)  (LRB): ARTHROPLASTY BIPOLAR HIP (HEMIARTHROPLASTY) (Right) Active Problems:   Closed right hip fracture (HCC)  Estimated body mass index is 23.3 kg/(m^2) as calculated from the following:   Height as of this encounter: 5\' 5"  (1.651 m).   Weight as of this encounter: 63.504 kg (140 lb). Up with therapy Discharge to SNF once patient is medically cleared  Labs: Were reviewed DVT Prophylaxis - Lovenox, Foot Pumps and TED hose Weight-Bearing as tolerated to right leg Please review earlier dictation for discharge instructions  Wille Glaser R. [place] 04/11/2015, 7:36 AM

## 2015-04-11 NOTE — Progress Notes (Signed)
Report called to nurse Ernest Haber, RN. Family to transport patient to [facility]. Packet given to son to give to nursing staff at [facility].

## 2015-04-11 NOTE — Discharge Instructions (Signed)
Hip Fracture A hip fracture is a fracture of the upper part of your thigh bone (femur).  CAUSES A hip fracture is caused by a direct blow to the side of your hip. This is usually the result of a fall but can occur in other circumstances, such as an automobile accident. RISK FACTORS There is an increased risk of hip fractures in people with:  An unsteady walking pattern (gait) and those with conditions that contribute to poor balance, such as Parkinson's disease or dementia.  Osteopenia and osteoporosis.  Cancer that spreads to the leg bones.  Certain metabolic diseases. SYMPTOMS  Symptoms of hip fracture include:  Pain over the injured hip.  Inability to put weight on the leg in which the fracture occurred (although, some patients are able to walk after a hip fracture).  Toes and foot of the affected leg point outward when you lie down. DIAGNOSIS A physical exam can determine if a hip fracture is likely to have occurred. X-ray exams are needed to confirm the fracture and to look for other injuries. The X-ray exam can help to determine the type of hip fracture. Rarely, the fracture is not visible on an X-ray image and a CT scan or MRI will have to be done. TREATMENT  The treatment for a fracture is usually surgery. This means using a screw, nail, or rod to hold the bones in place.  HOME CARE INSTRUCTIONS Take all medicines as directed by your health care provider. SEEK MEDICAL CARE IF: Pain continues, even after taking pain medicine. MAKE SURE YOU:  Understand these instructions.   Will watch your condition.  Will get help right away if you are not doing well or get worse.   This information is not intended to replace advice given to you by your health care provider. Make sure you discuss any questions you have with your health care provider.   Document Released: 02/18/2005 Document Revised: 02/23/2013 Document Reviewed: 09/30/2012 Elsevier Interactive Patient Education 2016  Elsevier Inc.  

## 2015-04-11 NOTE — Progress Notes (Signed)
Patient is medically stable for D/C to [facility] in [place], [place] today. Per [facility] admissions coordinator at [facility] patient can come today to room 24-B. RN will call report at [phone]. Patient's son [name] and husband [name] are going to transport patient in private vehicle to [facility]. Clinical Education officer, museum (CSW) sent D/C Summary, FL2 and D/C Packet to [facility] via [employer]. CSW also faxed prescriptions to [facility] today. CSW contacted patient's daughter [name] and made her aware of above. Please reconsult if future social work needs arise. CSW signing off.   Blima Rich, LCSW [phone]

## 2015-04-11 NOTE — Clinical Social Work Placement (Signed)
   CLINICAL SOCIAL WORK PLACEMENT  NOTE  Date:  04/11/2015  Patient Details  Name: Anita Powell MRN: [id number] Date of Birth: [birth year]  Clinical Social Work is seeking post-discharge placement for this patient at the [place] level of care (*CSW will initial, date and re-position this form in  chart as items are completed):  Yes   Patient/family provided with [facility] Work Department's list of facilities offering this level of care within the geographic area requested by the patient (or if unable, by the patient's family).  Yes   Patient/family informed of their freedom to choose among providers that offer the needed level of care, that participate in Medicare, Medicaid or managed care program needed by the patient, have an available bed and are willing to accept the patient.  Yes   Patient/family informed of [facility]'s ownership interest in [facility] and [facility], as well as of the fact that they are under no obligation to receive care at these facilities.  PASRR submitted to EDS on       PASRR number received on       Existing PASRR number confirmed on 04/10/15     FL2 transmitted to all facilities in geographic area requested by pt/family on 04/10/15     FL2 transmitted to all facilities within larger geographic area on 04/10/15     Patient informed that his/her managed care company has contracts with or will negotiate with certain facilities, including the following:        Yes   Patient/family informed of bed offers received.  Patient chooses bed at  ([facility] [place], [place]) )     Physician recommends and patient chooses bed at      Patient to be transferred to  ([facility] [place], [place]) ) on 04/11/15.  Patient to be transferred to facility by  (Patient's family will transport in private vehicle. )     Patient family notified on 04/11/15 of transfer.  Name of family member notified:   (Patient's daughter  [name] is aware of D/C today. )     PHYSICIAN       Additional Comment:    _______________________________________________ Loralyn Freshwater, LCSW 04/11/2015, 9:45 AM

## 2015-04-11 NOTE — Discharge Summary (Signed)
[place] [facility] [place] NAME: Josue Sanfratello    MR#:  [id number]  DATE OF BIRTH:  February 20, 1927  DATE OF ADMISSION:  04/07/2015 ADMITTING PHYSICIAN: Nicholes Mango, MD  DATE OF DISCHARGE: 04/11/2015  PRIMARY CARE PHYSICIAN: No primary care provider on file.    ADMISSION DIAGNOSIS:  Closed right hip fracture, initial encounter ([place]) [S72.001A]  DISCHARGE DIAGNOSIS:  Active Problems:   Closed right hip fracture (HCC)   SECONDARY DIAGNOSIS:   Past Medical History  Diagnosis Date  . Breast cancer ([place]) 2001    radiation  . Hypertension   . Hypothyroidism   . Dementia     HOSPITAL COURSE:   1. Acute in-hospital delirium. Likely a combination of anesthesia, pain medications and not sleeping too well. I ended up giving when necessary Haldol orally as needed for agitation and Seroquel 25 mg at night. Upon discharge the patient is answering questions appropriately and slept better the night before. Patient is stable for discharge to rehabilitation. I would continue the Seroquel 25 mg at night. Do not believe she has any withdrawal symptoms from alcohol. 2. Hypomagnesemia and hypokalemia- these were replaced and postoperatively and now back in the normal range 3. Essential hypertension continue metoprolol 4. Hypothyroidism unspecified continue levothyroxine 5. Dementia with behavioral disturbance on Namenda. I needed to give Haldol and Geodon and Seroquel at night. Patient is now answering questions appropriately 6. Right hip fracture requiring operative repair. With her in hospital delirium I will not give pain medications on discharge. Tylenol as needed for pain. Lovenox injections for 2 weeks to prevent DVT. Staples removal in 2 weeks. Shower in 2 weeks. Follow-up with orthopedics in 6 weeks. Stable to undergo rehabilitation. 7. Urine culture is a contamination no treatment necessary. Urinalysis was completely negative  DISCHARGE CONDITIONS:    Satisfactory  CONSULTS OBTAINED:  Treatment Team:  Dereck Leep, MD Nicholes Mango, MD  DRUG ALLERGIES:  No Known Allergies  DISCHARGE MEDICATIONS:   Current Discharge Medication List    START taking these medications   Details  acetaminophen (TYLENOL) 325 MG tablet Take 2 tablets (650 mg total) by mouth every 6 (six) hours as needed for mild pain (or Fever >/= 101).    enoxaparin (LOVENOX) 40 MG/0.4ML injection Inject 0.4 mLs (40 mg total) into the skin daily. Qty: 14 Syringe, Refills: 0    QUEtiapine (SEROQUEL) 25 MG tablet Take 1 tablet (25 mg total) by mouth at bedtime. Qty: 30 tablet, Refills: 0      CONTINUE these medications which have NOT CHANGED   Details  aspirin EC 81 MG tablet Take 81 mg by mouth at bedtime.    cholecalciferol (VITAMIN D) 1000 units tablet Take 2,000 Units by mouth at bedtime.    levothyroxine (SYNTHROID, LEVOTHROID) 137 MCG tablet Take 137 mcg by mouth daily before breakfast.    memantine (NAMENDA) 10 MG tablet Take 10 mg by mouth 2 (two) times daily.    metoprolol succinate (TOPROL-XL) 50 MG 24 hr tablet Take 50 mg by mouth daily. Take with or immediately following a meal.    omeprazole (PRILOSEC) 20 MG capsule Take 20 mg by mouth daily.         DISCHARGE INSTRUCTIONS:   Follow-up with Dr. rehabilitation and physical therapy one day  If you experience worsening of your admission symptoms, develop shortness of breath, life threatening emergency, suicidal or homicidal thoughts you must seek medical attention immediately by calling 911 or calling your MD  immediately  if symptoms less severe.  You Must read complete instructions/literature along with all the possible adverse reactions/side effects for all the Medicines you take and that have been prescribed to you. Take any new Medicines after you have completely understood and accept all the possible adverse reactions/side effects.   Please note  You were cared for by a hospitalist  during your hospital stay. If you have any questions about your discharge medications or the care you received while you were in the hospital after you are discharged, you can call the unit and asked to speak with the hospitalist on call if the hospitalist that took care of you is not available. Once you are discharged, your primary care physician will handle any further medical issues. Please note that NO REFILLS for any discharge medications will be authorized once you are discharged, as it is imperative that you return to your primary care physician (or establish a relationship with a primary care physician if you do not have one) for your aftercare needs so that they can reassess your need for medications and monitor your lab values.    Today   CHIEF COMPLAINT:   Chief Complaint  Patient presents with  . Fall  . Hip Pain    HISTORY OF PRESENT ILLNESS:  Junnie Kemme  is a 82 y.o. female with a known history of dementia presents with fall and found to have a hip fracture   VITAL SIGNS:  Blood pressure 148/70, pulse 68, temperature 97.6 F (36.4 C), temperature source Oral, resp. rate 18, height 5\' 5"  (1.651 m), weight 63.504 kg (140 lb), SpO2 95 %.    PHYSICAL EXAMINATION:  GENERAL:  82 y.o.-year-old patient lying in the bed with no acute distress.  EYES: Pupils equal, round, reactive to light and accommodation. No scleral icterus. Extraocular muscles intact.  HEENT: Head atraumatic, normocephalic. Oropharynx and nasopharynx clear.  NECK:  Supple, no jugular venous distention. No thyroid enlargement, no tenderness.  LUNGS: Normal breath sounds bilaterally, no wheezing, rales,rhonchi or crepitation. No use of accessory muscles of respiration.  CARDIOVASCULAR: S1, S2 normal. 2/6 systolic murmurs, no rubs, or gallops.  ABDOMEN: Soft, non-tender, non-distended. Bowel sounds present. No organomegaly or mass.  EXTREMITIES: No pedal edema, cyanosis, or clubbing.  NEUROLOGIC: Cranial  nerves II through XII are intact. Sensation intact. Gait not checked.  PSYCHIATRIC: The patient is alert and answers questions appropriately.  SKIN: No obvious rash, lesion, or ulcer.   DATA REVIEW:   CBC  Recent Labs Lab 04/11/15 0551  WBC 6.0  HGB 13.8  HCT 41.4  PLT 164    Chemistries   Recent Labs Lab 04/10/15 0435 04/11/15 0551  NA 143 139  K 4.6 3.8  CL 109 106  CO2 27 25  GLUCOSE 116* 99  BUN 9 17  CREATININE 0.59 0.55  CALCIUM 8.8* 8.5*  MG 2.3  --     Cardiac Enzymes No results for input(s): TROPONINI in the last 168 hours.  Microbiology Results  Results for orders placed or performed during the hospital encounter of 04/07/15  MRSA PCR Screening     Status: None   Collection Time: 04/07/15 11:15 PM  Result Value Ref Range Status   MRSA by PCR NEGATIVE NEGATIVE Final    Comment:        The GeneXpert MRSA Assay (FDA approved for NASAL specimens only), is one component of a comprehensive MRSA colonization surveillance program. It is not intended to diagnose MRSA infection nor to guide  or monitor treatment for MRSA infections.   Urine culture     Status: None   Collection Time: 04/07/15 11:58 PM  Result Value Ref Range Status   Specimen Description URINE, RANDOM  Final   Special Requests NONE  Final   Culture >=100,000 COLONIES/mL STAPHYLOCOCCUS EPIDERMIDIS  Final   Report Status 04/10/2015 FINAL  Final   Organism ID, Bacteria STAPHYLOCOCCUS EPIDERMIDIS  Final     Management plans discussed with the patient, family and they are in agreement.  CODE STATUS:  Code Status History    Date Active Date Inactive Code Status Order ID Comments User Context   04/07/2015  8:17 PM 04/08/2015  1:08 PM Full Code [id number]  Nicholes Mango, MD Inpatient   04/07/2015  5:49 PM 04/07/2015  8:17 PM Full Code [id number]  Dereck Leep, MD ED      TOTAL TIME TAKING CARE OF THIS PATIENT: 35 minutes.    Loletha Grayer M.D on 04/11/2015 at 8:10 AM  Between 7am to 6pm -  Pager - [phone]  After 6pm go to [email or web address] - password [email or web address] [facility]  [facility]  Office  [phone]  CC: Primary care physician; No primary care provider on file.

## 2015-04-11 NOTE — Progress Notes (Signed)
Assisted patient to the car with 3 nursing staff with max assist. Advised son to call the staff on arrival to Rehab(Number given for [facility]) and not to get the patient out of the car till reached rehab. Packet given to son with Rx.slip to give to Rehab.

## 2015-04-11 NOTE — Progress Notes (Signed)
Physical Therapy Treatment Patient Details Name: Anita Powell MRN: [id number] DOB: [birth year] Today's Date: 04/11/2015    History of Present Illness Pt is an 82 y.o. female with PMH of hypertension, breast cancer, and left hip fracture s/p surgery.  Pt s/p mechanical fall tripping over her cats.  Pt was admitted with right hip fracture and s/p right unipolar arthroplasty 04/08/15.  Pt with post op delirium.    PT Comments    Pt was somnolent and lethargic during session.  Pt did not remember posterior hip precautions.  Pt performed in bed exercises that needed tactile and verbal cueing.  Pt required mod assist for bed mobility (rolling and side lying to sit) to sit EOB.  At EOB, pt required mod assist to maintain sitting up.  Pt required cueing to participate and stay awake for therapy but pt agreeable to participating in therapy.   Follow Up Recommendations  SNF     Equipment Recommendations  3in1 (PT);Rolling walker with 5" wheels    Recommendations for Other Services       Precautions / Restrictions Precautions Precautions: Fall;Posterior Hip Precaution Booklet Issued: No (acute delirium) Restrictions Weight Bearing Restrictions: Yes RLE Weight Bearing: Weight bearing as tolerated    Mobility  Bed Mobility Overal bed mobility: Needs Assistance Bed Mobility: Rolling;Sit to Sidelying Rolling: Mod assist Sidelying to sit: Mod assist     Sit to sidelying: Mod assist    Transfers                    Ambulation/Gait                 Stairs            Wheelchair Mobility    Modified Rankin (Stroke Patients Only)       Balance Overall balance assessment: Needs assistance Sitting-balance support: Single extremity supported (PT hand held assist )  Feet supported. Sitting balance-Leahy Scale: Poor (right lateral lean)                              Cognition Arousal/Alertness: Awake/alert Behavior During Therapy: Flat  affect Overall Cognitive Status: No family/caregiver present to determine baseline cognitive functioning       Memory: Decreased recall of precautions;Decreased short-term memory              Exercises Total Joint Exercises Ankle Circles/Pumps: AROM;Both;10 reps Quad Sets: AROM;Right;10 reps Short Arc Quad: AROM;Both;10 reps Heel Slides: AAROM;Right;10 reps Hip ABduction/ADduction: AAROM;Right;10 reps    General Comments   Nursing was contacted and cleared pt for physical therapy.  Pt was agreeable and tolerated session well.       Pertinent Vitals/Pain Pain Assessment: No/denies pain      Home Living                      Prior Function            PT Goals (current goals can now be found in the care plan section) Acute Rehab PT Goals Patient Stated Goal: to go home  PT Goal Formulation: With patient Time For Goal Achievement: 04/24/15 Potential to Achieve Goals: Fair Progress towards PT goals: Progressing toward goals    Frequency  BID    PT Plan Current plan remains appropriate    Co-evaluation             End of Session Equipment Utilized During Treatment: Gait belt  Activity Tolerance: Patient limited by fatigue Patient left: in bed;with call bell/phone within reach;with bed alarm set (SCD's were off before session)     Time: [id number] PT Time Calculation (min) (ACUTE ONLY): 28 min  Charges:                       G Codes:      Mittie Bodo, SPT Mittie Bodo 04/11/2015, 11:23 AM

## 2015-04-12 LAB — SURGICAL PATHOLOGY

## 2015-10-28 ENCOUNTER — Emergency Department: Payer: Medicare Other

## 2015-10-28 ENCOUNTER — Inpatient Hospital Stay
Admission: EM | Admit: 2015-10-28 | Discharge: 2015-10-31 | DRG: 536 | Disposition: A | Discharge status: Skilled Nursing Facility | Payer: Medicare Other | Attending: Orthopedic Surgery | Admitting: Orthopedic Surgery

## 2015-10-28 DIAGNOSIS — S7225XA Nondisplaced subtrochanteric fracture of left femur, initial encounter for closed fracture: Principal | ICD-10-CM | POA: Diagnosis present

## 2015-10-28 DIAGNOSIS — Z7982 Long term (current) use of aspirin: Secondary | ICD-10-CM | POA: Diagnosis not present

## 2015-10-28 DIAGNOSIS — S72002A Fracture of unspecified part of neck of left femur, initial encounter for closed fracture: Secondary | ICD-10-CM | POA: Diagnosis present

## 2015-10-28 DIAGNOSIS — R52 Pain, unspecified: Secondary | ICD-10-CM

## 2015-10-28 DIAGNOSIS — Z96641 Presence of right artificial hip joint: Secondary | ICD-10-CM | POA: Diagnosis present

## 2015-10-28 DIAGNOSIS — E039 Hypothyroidism, unspecified: Secondary | ICD-10-CM | POA: Diagnosis present

## 2015-10-28 DIAGNOSIS — I10 Essential (primary) hypertension: Secondary | ICD-10-CM | POA: Diagnosis present

## 2015-10-28 DIAGNOSIS — Z853 Personal history of malignant neoplasm of breast: Secondary | ICD-10-CM

## 2015-10-28 DIAGNOSIS — Z803 Family history of malignant neoplasm of breast: Secondary | ICD-10-CM

## 2015-10-28 DIAGNOSIS — W19XXXA Unspecified fall, initial encounter: Secondary | ICD-10-CM | POA: Diagnosis present

## 2015-10-28 DIAGNOSIS — M9702XA Periprosthetic fracture around internal prosthetic left hip joint, initial encounter: Secondary | ICD-10-CM | POA: Diagnosis present

## 2015-10-28 DIAGNOSIS — Y92129 Unspecified place in nursing home as the place of occurrence of the external cause: Secondary | ICD-10-CM | POA: Diagnosis not present

## 2015-10-28 DIAGNOSIS — Z79899 Other long term (current) drug therapy: Secondary | ICD-10-CM

## 2015-10-28 DIAGNOSIS — F0390 Unspecified dementia without behavioral disturbance: Secondary | ICD-10-CM | POA: Diagnosis present

## 2015-10-28 MED ORDER — MAGNESIUM CITRATE PO SOLN
1.0000 | Freq: Once | ORAL | Status: DC | PRN
Start: 2015-10-28 — End: 2015-10-31

## 2015-10-28 MED ORDER — HEPARIN SODIUM (PORCINE) 5000 UNIT/ML IJ SOLN
5000.0000 [IU] | Freq: Three times a day (TID) | INTRAMUSCULAR | Status: DC
  Administered 2015-10-29 – 2015-10-31 (×7): 5000 [IU] via SUBCUTANEOUS
  Filled 2015-10-28 (×7): qty 1

## 2015-10-28 MED ORDER — MAGNESIUM HYDROXIDE 400 MG/5ML PO SUSP: 30.0000 mL | Freq: Every day | ORAL | Status: DC | PRN

## 2015-10-28 MED ORDER — ACETAMINOPHEN 325 MG PO TABS
650.0000 mg | ORAL_TABLET | Freq: Four times a day (QID) | ORAL | Status: DC | PRN
  Administered 2015-10-29: 650 mg via ORAL
  Administered 2015-10-29: 325 mg via ORAL
  Administered 2015-10-31: 650 mg via ORAL
  Filled 2015-10-28: qty 2
  Filled 2015-10-28: qty 1
  Filled 2015-10-28: qty 2

## 2015-10-28 MED ORDER — BISACODYL 5 MG PO TBEC: 5.0000 mg | DELAYED_RELEASE_TABLET | Freq: Every day | ORAL | Status: DC | PRN

## 2015-10-28 MED ORDER — METHOCARBAMOL 1000 MG/10ML IJ SOLN: 500.0000 mg | Freq: Four times a day (QID) | INTRAVENOUS | Status: DC | PRN

## 2015-10-28 MED ORDER — MORPHINE SULFATE (PF) 2 MG/ML IV SOLN: 2.0000 mg | INTRAVENOUS | Status: DC | PRN

## 2015-10-28 MED ORDER — SODIUM CHLORIDE 0.9 % IV SOLN
INTRAVENOUS | Status: DC
  Administered 2015-10-29 (×2): via INTRAVENOUS

## 2015-10-28 MED ORDER — METHOCARBAMOL 500 MG PO TABS: 500.0000 mg | ORAL_TABLET | Freq: Four times a day (QID) | ORAL | Status: DC | PRN

## 2015-10-28 MED ORDER — DOCUSATE SODIUM 100 MG PO CAPS
100.0000 mg | ORAL_CAPSULE | Freq: Two times a day (BID) | ORAL | Status: DC
  Administered 2015-10-29 – 2015-10-31 (×5): 100 mg via ORAL
  Filled 2015-10-28 (×5): qty 1

## 2015-10-28 MED ORDER — HYDROCODONE-ACETAMINOPHEN 5-325 MG PO TABS
1.0000 | ORAL_TABLET | Freq: Four times a day (QID) | ORAL | Status: DC | PRN
  Administered 2015-10-30 – 2015-10-31 (×3): 1 via ORAL
  Filled 2015-10-28 (×3): qty 1

## 2015-10-28 NOTE — H&P (Signed)
PREOPERATIVE H&P  Chief Complaint: Left hip pain s/p fall  HPI: Anita Powell is a 82 y.o. female who presents present is to the [facility] emergency department with left hip pain. She sustained a fall yesterday per her daughter who is with her in the ER. He shouldn't has undergone bilateral hip hemiarthroplasties. Her left side was performed by Dr. Earnestine Leys in September 2013. Patient had been doing well until her fall recently. Patient stays at a nursing facility and is ambulatory at baseline. She does suffer from dementia.  Past Medical History:  Diagnosis Date  . Breast cancer ([place]) 2001   radiation  . Dementia   . Hypertension   . Hypothyroidism    Past Surgical History:  Procedure Laterality Date  . BREAST EXCISIONAL BIOPSY Left 2001   positive  . HIP ARTHROPLASTY Right 04/08/2015   Procedure: ARTHROPLASTY BIPOLAR HIP (HEMIARTHROPLASTY);  Surgeon: Dereck Leep, MD;  Location: [facility] ORS;  Service: Orthopedics;  Laterality: Right;  . JOINT REPLACEMENT    . Left hip hemiarthroplasty  2013   Social History   Social History  . Marital status: Married    Spouse name: N/A  . Number of children: N/A  . Years of education: N/A   Social History Main Topics  . Smoking status: Never Smoker  . Smokeless tobacco: Never Used  . Alcohol use Yes     Comment: 3 oz scotch a day   . Drug use: Unknown  . Sexual activity: Not Asked   Other Topics Concern  . None   Social History Narrative  . None   Family History  Problem Relation Age of Onset  . Breast cancer Sister 82  . Breast cancer Paternal Aunt 31   No Known Allergies Prior to Admission medications   Medication Sig Start Date End Date Taking? Authorizing Provider  acetaminophen (TYLENOL) 325 MG tablet Take 2 tablets (650 mg total) by mouth every 6 (six) hours as needed for mild pain (or Fever >/= 101). 04/11/15   Loletha Grayer, MD  aspirin EC 81 MG tablet Take 81 mg by mouth at bedtime.    Historical Provider,  MD  cholecalciferol (VITAMIN D) 1000 units tablet Take 2,000 Units by mouth at bedtime.    Historical Provider, MD  enoxaparin (LOVENOX) 40 MG/0.4ML injection Inject 0.4 mLs (40 mg total) into the skin daily. 04/11/15   Loletha Grayer, MD  levothyroxine (SYNTHROID, LEVOTHROID) 137 MCG tablet Take 137 mcg by mouth daily before breakfast.    Historical Provider, MD  memantine (NAMENDA) 10 MG tablet Take 10 mg by mouth 2 (two) times daily.    Historical Provider, MD  metoprolol succinate (TOPROL-XL) 50 MG 24 hr tablet Take 50 mg by mouth daily. Take with or immediately following a meal.    Historical Provider, MD  omeprazole (PRILOSEC) 20 MG capsule Take 20 mg by mouth daily.    Historical Provider, MD  QUEtiapine (SEROQUEL) 25 MG tablet Take 1 tablet (25 mg total) by mouth at bedtime. 04/11/15   Loletha Grayer, MD     Positive ROS: All other systems have been reviewed and were otherwise negative with the exception of those mentioned in the HPI and as above.  Physical Exam: General: Alert, no acute distress Cardiovascular: Regular rate and rhythm, no murmurs rubs or gallops.  No pedal edema Respiratory: Clear to auscultation bilaterally, no wheezes rales or rhonchi. No cyanosis, no use of accessory musculature GI: No organomegaly, abdomen is soft and non-tender nondistended with positive  bowel sounds. Skin: Skin intact, no lesions within the operative field. Neurologic: Sensation intact distally  MUSCULOSKELETAL: Left lower extremity: Patient's skin is intact. She has a small area of ecchymosis over the lateral hip. Patient had tenderness over the greater trochanter. She had no pain with logrolling of the left hip with flexion and hip rotation she had mild to moderate pain. I distally shows neurovascular intact with palpable pedal pulses. She has intact sensation to light touch in both lower extremities. There is no shortening or external rotation of her left lower extremity.  Assessment: Greater  trochanteric hip fracture versus periprosthetic fracture left hip  Plan: Patient is going to be admitted for pain control and further workup of her left hip. I'm ordering a CT scan of the hip to determine whether patient has a true periprosthetic fracture or whether fractures isolated to the greater trochanter. At the fracture is isolated to the greater trochanter nonoperative management will be required. A periprosthetic fracture may require surgical intervention based on the findings of the CT scan. I will review the CT in the morning. The patient is written for pain medications to keep her comfortable overnight. I will have social work see the patient for possible placement if the fracture does not require surgery.  Currently she is nonweightbearing. She'll be placed on heparin for DVT prophylaxis. She may have a regular diet tonight. I will discuss the findings of CT scan with the patient's daughter tomorrow once the results are available.    Thornton Park, MD   10/28/2015 11:59 PM

## 2015-10-28 NOTE — ED Notes (Signed)
Placed fall mats next to bed, bed alarm on pt.

## 2015-10-28 NOTE — ED Notes (Signed)
Patient transported to CT 

## 2015-10-28 NOTE — ED Notes (Signed)
Pt returned from xray

## 2015-10-28 NOTE — ED Provider Notes (Addendum)
[facility] Emergency Department Provider Note   ____________________________________________   First MD Initiated Contact with Patient 10/28/15 2128     (approximate)  I have reviewed the triage vital signs and the nursing notes.   HISTORY  Chief Complaint Fall    HPI Anita Powell is a 82 y.o. female dementia. She [facility] the [facility] today she had fallen and when she got home with family could not bear weight on her left leg. She complained of pain in the mid left femur and on the left hip. Patient takes Vioxx apparent. She denies any other pain. Patient does not seem to remember the fall.   Past Medical History:  Diagnosis Date  . Breast cancer ([place]) 2001   radiation  . Dementia   . Hypertension   . Hypothyroidism     Patient Active Problem List   Diagnosis Date Noted  . Closed right hip fracture ([place]) 04/07/2015    Past Surgical History:  Procedure Laterality Date  . BREAST EXCISIONAL BIOPSY Left 2001   positive  . HIP ARTHROPLASTY Right 04/08/2015   Procedure: ARTHROPLASTY BIPOLAR HIP (HEMIARTHROPLASTY);  Surgeon: Dereck Leep, MD;  Location: [facility] ORS;  Service: Orthopedics;  Laterality: Right;  . JOINT REPLACEMENT    . Left hip hemiarthroplasty  2013    Prior to Admission medications   Medication Sig Start Date End Date Taking? Authorizing Provider  acetaminophen (TYLENOL) 325 MG tablet Take 2 tablets (650 mg total) by mouth every 6 (six) hours as needed for mild pain (or Fever >/= 101). 04/11/15   Loletha Grayer, MD  aspirin EC 81 MG tablet Take 81 mg by mouth at bedtime.    Historical Provider, MD  cholecalciferol (VITAMIN D) 1000 units tablet Take 2,000 Units by mouth at bedtime.    Historical Provider, MD  enoxaparin (LOVENOX) 40 MG/0.4ML injection Inject 0.4 mLs (40 mg total) into the skin daily. 04/11/15   Loletha Grayer, MD  levothyroxine (SYNTHROID, LEVOTHROID) 137 MCG tablet Take 137  mcg by mouth daily before breakfast.    Historical Provider, MD  memantine (NAMENDA) 10 MG tablet Take 10 mg by mouth 2 (two) times daily.    Historical Provider, MD  metoprolol succinate (TOPROL-XL) 50 MG 24 hr tablet Take 50 mg by mouth daily. Take with or immediately following a meal.    Historical Provider, MD  omeprazole (PRILOSEC) 20 MG capsule Take 20 mg by mouth daily.    Historical Provider, MD  QUEtiapine (SEROQUEL) 25 MG tablet Take 1 tablet (25 mg total) by mouth at bedtime. 04/11/15   Loletha Grayer, MD    Allergies Review of patient's allergies indicates no known allergies.  Family History  Problem Relation Age of Onset  . Breast cancer Sister 33  . Breast cancer Paternal Aunt 55    Social History Social History  Substance Use Topics  . Smoking status: Never Smoker  . Smokeless tobacco: Never Used  . Alcohol use Yes     Comment: 3 oz scotch a day     Review of Systems Constitutional: No fever/chills Eyes: No visual changes. ENT: No sore throat. Cardiovascular: Denies chest pain. Respiratory: Denies shortness of breath. Gastrointestinal: No abdominal pain.  No nausea, no vomiting.  No diarrhea.  No constipation. Genitourinary: Negative for dysuria. Musculoskeletal: Negative for back pain. Skin: Negative for rash. Neurological: Negative for headaches, focal weakness or numbness.  10-point ROS otherwise negative.  ____________________________________________   PHYSICAL EXAM:  VITAL SIGNS: ED Triage Vitals  Enc Vitals Group     BP      Pulse      Resp      Temp      Temp src      SpO2      Weight      Height      Head Circumference      Peak Flow      Pain Score      Pain Loc      Pain Edu?      Excl. in [place]?     Constitutional: Alert and oriented. Well appearing and in no acute distress. Eyes: Conjunctivae are normal. PERRL. EOMI. Head: Atraumatic. Nose: No congestion/rhinnorhea. Mouth/Throat: Mucous membranes are moist.  Oropharynx  non-erythematous. Neck: No stridor Cardiovascular: Normal rate, regular rhythm. Grossly normal heart sounds.  Good peripheral circulation. Respiratory: Normal respiratory effort.  No retractions. Lungs CTAB. Gastrointestinal: Soft and nontender. No distention. No abdominal bruits. No CVA tenderness. Musculoskeletal: No lower extremity tenderness nor edema.  No joint effusions. Neurologic:  Normal speech and language. No gross focal neurologic deficits are appreciated. No gait instability. Skin:  Skin is warm, dry and intact. No rash noted. Psychiatric: Mood and affect are normal. Speech and behavior are normal.  ____________________________________________   LABS (all labs ordered are listed, but only abnormal results are displayed)  Labs Reviewed - No data to display ____________________________________________  EKG   ____________________________________________  RADIOLOGY CLINICAL DATA:  Post unwitnessed fall with confusion.  EXAM: CT HEAD WITHOUT CONTRAST  TECHNIQUE: Contiguous axial images were obtained from the base of the skull through the vertex without intravenous contrast.  COMPARISON:  None.  FINDINGS: Brain: No evidence of acute infarction, hemorrhage, hydrocephalus, extra-axial collection or mass lesion/mass effect. There is marked brain parenchymal atrophy and microvascular angiopathy.  Vascular: Calcific atherosclerotic disease of the intracranial vessels of the skull base.  Skull: No displaced fractures.  Sinuses/Orbits: No acute finding.  Other: None.  IMPRESSION: No acute intracranial abnormality.  Marked brain parenchymal atrophy and chronic microvascular disease.   Electronically Signed   By: Fidela Salisbury M.D.   On: 10/28/2015 21:52  ____CLINICAL DATA:  Difficulty bearing weight on LEFT leg during dinner today. Fall 1 day prior.  EXAM: PELVIS - 1-2 VIEW; LEFT FEMUR 2 VIEWS  COMPARISON:   None.  FINDINGS: Acute mildly displaced fracture of the proximal femur at the level of the mid non cemented stem, status post LEFT hip hemiarthroplasty with intact well-seated hardware, no periprosthetic lucency. Osteopenia. No destructive bony lesions. Coarse calcification in the pelvis compatible with involuted fibroid. Partially imaged RIGHT hip hemiarthroplasty with cemented stem. Phleboliths in the pelvis.  IMPRESSION: Acute minimally displaced proximal femur fracture of the level of the mid stem, status post LEFT hip hemiarthroplasty. No dislocation.   Electronically Signed   By: Elon Alas M.D.   On: 10/28/2015 22:48 ____________________________________   PROCEDURES  Procedure(s) performed:   Procedures  Critical Care performed:   ____________________________________________   INITIAL IMPRESSION / ASSESSMENT AND PLAN / ED COURSE  Pertinent labs & imaging results that were available during my care of the patient were reviewed by me and considered in my medical decision making (see chart for details).    Clinical Course     ____________________________________________   FINAL CLINICAL IMPRESSION(S) / ED DIAGNOSES  Final diagnoses:  Hip fracture, left, closed, initial encounter ([place])    Real diagnosis is periprosthetic fracture of the left hip likely limited  to the greater trochanter.  NEW MEDICATIONS STARTED DURING THIS VISIT:  New Prescriptions   No medications on file     Note:  This document was prepared using Dragon voice recognition software and may include unintentional dictation errors.   Discussed with Dr. Mack Guise. He says we do not work on these counter fractures here. In view of the patient's dementia and age he says we could try nonoperative management nonweightbearing for 6-8 weeks or transfer to a different facility where they do the kind of surgery she will need with a longer shaft did implant. Daughter is just a few  minutes out of the hospital we will await toe she gets here and discussed this with her right now on the phone she is leaning toward trying to get her back to [place] and be nonweightbearing    Nena Polio, MD 10/28/15 2337 Mack Guise comes talked to the patient he will admit the patient overnight CT the patient's hip.   Nena Polio, MD 10/28/15 [phone]

## 2015-10-28 NOTE — ED Triage Notes (Signed)
Per EMS: Pt from family home, however normally lives at [facility] skilled [facility] facility. Pt was having dinner with family, and unable to tolerate weight on left leg. Pt reported to her family that she fell the previous day.   Pt c/o left leg pain, at mid thigh, and left hip pain. Pt has hx of bilateral hip fracture, osteoporosis, hypothyroidism, hypertension and dementia

## 2015-10-28 NOTE — Consult Note (Signed)
Called by Dr. Conni Slipper to review films of this 82 year old female who is s/p bilateral hip hemiarthroplasties.  She is reported to have fallen today and was brought to ER when it was noted she couldn't bear weight.   Xrays show a minimally displaced fracture of the proximal left femur around her left hip hemiarthroplasty prosthesis.  Walking on this fracture may cause further propagation of the fracture and displacement of the prosthesis.  Patient is reportedly in a memory care unit.  I explained to Dr. Cinda Quest that the choices for treatment would be 1) nonoperative management with no weightbearing on the left lower extremity most likely requiring use of a wheelchair or 2) [facility] to fix the periprosthetic fracture which may require revision of the prosthesis to a long stem, distally .   Dr. Cinda Quest will find out what the patient's family wants to do.

## 2015-10-29 LAB — COMPREHENSIVE METABOLIC PANEL
ALT: 15 U/L (ref 14–54)
ANION GAP: 6 (ref 5–15)
AST: 19 U/L (ref 15–41)
Albumin: 3.2 g/dL — ABNORMAL LOW (ref 3.5–5.0)
Alkaline Phosphatase: 77 U/L (ref 38–126)
BUN: 16 mg/dL (ref 6–20)
CHLORIDE: 102 mmol/L (ref 101–111)
CO2: 29 mmol/L (ref 22–32)
Calcium: 8.7 mg/dL — ABNORMAL LOW (ref 8.9–10.3)
Creatinine, Ser: 0.69 mg/dL (ref 0.44–1.00)
GFR calc non Af Amer: >60 mL/min (ref >60)
Glucose, Bld: 118 mg/dL — ABNORMAL HIGH (ref 65–99)
POTASSIUM: 3 mmol/L — AB (ref 3.5–5.1)
SODIUM: 137 mmol/L (ref 135–145)
Total Bilirubin: 0.4 mg/dL (ref 0.3–1.2)
Total Protein: 5.9 g/dL — ABNORMAL LOW (ref 6.5–8.1)

## 2015-10-29 LAB — CBC WITH DIFFERENTIAL/PLATELET
BASOS PCT: 1 %
Basophils Absolute: 0.1 10*3/uL (ref 0–0.1)
EOS ABS: 0.1 10*3/uL (ref 0–0.7)
EOS PCT: 1 %
HCT: 37.2 % (ref 35.0–47.0)
Hemoglobin: 13.2 g/dL (ref 12.0–16.0)
LYMPHS ABS: 1.8 10*3/uL (ref 1.0–3.6)
Lymphocytes Relative: 20 %
MCH: 31.2 pg (ref 26.0–34.0)
MCHC: 35.4 g/dL (ref 32.0–36.0)
MCV: 88.2 fL (ref 80.0–100.0)
MONOS PCT: 9 %
Monocytes Absolute: 0.8 10*3/uL (ref 0.2–0.9)
Neutro Abs: 6.1 10*3/uL (ref 1.4–6.5)
Neutrophils Relative %: 69 %
PLATELETS: 180 10*3/uL (ref 150–440)
RBC: 4.22 MIL/uL (ref 3.80–5.20)
RDW: 14 % (ref 11.5–14.5)
WBC: 8.8 10*3/uL (ref 3.6–11.0)

## 2015-10-29 LAB — SURGICAL PCR SCREEN
MRSA, PCR: NEGATIVE
STAPHYLOCOCCUS AUREUS: NEGATIVE

## 2015-10-29 MED ORDER — POTASSIUM CHLORIDE 20 MEQ PO PACK
40.0000 meq | PACK | Freq: Once | ORAL | Status: AC
  Administered 2015-10-29: 40 meq via ORAL
  Filled 2015-10-29: qty 2

## 2015-10-29 NOTE — Progress Notes (Signed)
Subjective:  Patient is an overnight with a nondisplaced periprosthetic fracture. She is seen sitting up in a hospital bed this evening eating dinner. Patient reports left hip pain as moderate.   Objective:   VITALS:   Vitals:   10/29/15 0110 10/29/15 0417 10/29/15 1030 10/29/15 1545  BP: 122/67 (!) 118/52 122/72 128/61  Pulse: 66 61 60 66  Resp: 18 18  20   Temp: 98.5 F (36.9 C) 97.8 F (36.6 C) 97.7 F (36.5 C) 97.8 F (36.6 C)  TempSrc: Oral Oral Rectal Oral  SpO2: 97% 96%  93%  Weight:      Height:        PHYSICAL EXAM:   Left lower extremity: Patient's skin remains intact overlying the left hip. There is mild ecchymosis but no erythema. There is no significant swelling. Her thigh and leg compartments are soft and compressible. She has intact sensation light touch in palpable pedal pulses. She has no calf tenderness and a negative Homans sign. She can flex and extend her toes and dorsiflex and plantarflex her ankle.   LABS  Results for orders placed or performed during the hospital encounter of 10/28/15 (from the past 24 hour(s))  Surgical pcr screen     Status: None   Collection Time: 10/29/15  1:02 AM  Result Value Ref Range   MRSA, PCR NEGATIVE NEGATIVE   Staphylococcus aureus NEGATIVE NEGATIVE  CBC WITH DIFFERENTIAL     Status: None   Collection Time: 10/29/15  1:12 AM  Result Value Ref Range   WBC 8.8 3.6 - 11.0 K/uL   RBC 4.22 3.80 - 5.20 MIL/uL   Hemoglobin 13.2 12.0 - 16.0 g/dL   HCT 37.2 35.0 - 47.0 %   MCV 88.2 80.0 - 100.0 fL   MCH 31.2 26.0 - 34.0 pg   MCHC 35.4 32.0 - 36.0 g/dL   RDW 14.0 11.5 - 14.5 %   Platelets 180 150 - 440 K/uL   Neutrophils Relative % 69 %   Neutro Abs 6.1 1.4 - 6.5 K/uL   Lymphocytes Relative 20 %   Lymphs Abs 1.8 1.0 - 3.6 K/uL   Monocytes Relative 9 %   Monocytes Absolute 0.8 0.2 - 0.9 K/uL   Eosinophils Relative 1 %   Eosinophils Absolute 0.1 0 - 0.7 K/uL   Basophils Relative 1 %   Basophils Absolute 0.1 0 - 0.1  K/uL  Comprehensive metabolic panel     Status: Abnormal   Collection Time: 10/29/15  1:12 AM  Result Value Ref Range   Sodium 137 135 - 145 mmol/L   Potassium 3.0 (L) 3.5 - 5.1 mmol/L   Chloride 102 101 - 111 mmol/L   CO2 29 22 - 32 mmol/L   Glucose, Bld 118 (H) 65 - 99 mg/dL   BUN 16 6 - 20 mg/dL   Creatinine, Ser 0.69 0.44 - 1.00 mg/dL   Calcium 8.7 (L) 8.9 - 10.3 mg/dL   Total Protein 5.9 (L) 6.5 - 8.1 g/dL   Albumin 3.2 (L) 3.5 - 5.0 g/dL   AST 19 15 - 41 U/L   ALT 15 14 - 54 U/L   Alkaline Phosphatase 77 38 - 126 U/L   Total Bilirubin 0.4 0.3 - 1.2 mg/dL   GFR calc non Af Amer >60 >60 mL/min   GFR calc Af Amer >60 >60 mL/min   Anion gap 6 5 - 15    Dg Pelvis 1-2 Views  Result Date: 10/28/2015 CLINICAL DATA:  Difficulty bearing weight  on LEFT leg during dinner today. Fall 1 day prior. EXAM: PELVIS - 1-2 VIEW; LEFT FEMUR 2 VIEWS COMPARISON:  None. FINDINGS: Acute mildly displaced fracture of the proximal femur at the level of the mid non cemented stem, status post LEFT hip hemiarthroplasty with intact well-seated hardware, no periprosthetic lucency. Osteopenia. No destructive bony lesions. Coarse calcification in the pelvis compatible with involuted fibroid. Partially imaged RIGHT hip hemiarthroplasty with cemented stem. Phleboliths in the pelvis. IMPRESSION: Acute minimally displaced proximal femur fracture of the level of the mid stem, status post LEFT hip hemiarthroplasty. No dislocation. Electronically Signed   By: Elon Alas M.D.   On: 10/28/2015 22:48   Ct Head Wo Contrast  Result Date: 10/28/2015 CLINICAL DATA:  Post unwitnessed fall with confusion. EXAM: CT HEAD WITHOUT CONTRAST TECHNIQUE: Contiguous axial images were obtained from the base of the skull through the vertex without intravenous contrast. COMPARISON:  None. FINDINGS: Brain: No evidence of acute infarction, hemorrhage, hydrocephalus, extra-axial collection or mass lesion/mass effect. There is marked brain  parenchymal atrophy and microvascular angiopathy. Vascular: Calcific atherosclerotic disease of the intracranial vessels of the skull base. Skull: No displaced fractures. Sinuses/Orbits: No acute finding. Other: None. IMPRESSION: No acute intracranial abnormality. Marked brain parenchymal atrophy and chronic microvascular disease. Electronically Signed   By: Fidela Salisbury M.D.   On: 10/28/2015 21:52   Ct Hip Left Wo Contrast  Result Date: 10/29/2015 CLINICAL DATA:  Fall. Evaluate known left femur fracture for surgical planning. EXAM: CT OF THE LEFT HIP WITHOUT CONTRAST TECHNIQUE: Multidetector CT imaging of the left hip was performed according to the standard protocol. Multiplanar CT image reconstructions were also generated. COMPARISON:  Left hip 10/28/2015 FINDINGS: Bones/Joint/Cartilage Left hip hemiarthroplasty with non cemented components. Component appears well seated although artifact from the metallic hardware obscures visualization. No acetabular dislocation. A minimally displaced sub trochanteric fracture is demonstrated in the proximal left femur. The fracture line appears to also extend to the greater trochanter. Because of the artifact, the fracture is probably better seen on plain films. There is evidence of cortical disruption and linear lucency corresponding to the fracture. Ligaments Suboptimally assessed by CT. Muscles and Tendons No muscular hematoma or infiltration demonstrated. Soft tissues No significant abnormalities demonstrated. IMPRESSION: Left hip hemiarthroplasty without dislocation. Artifact from the arthroplasty limits evaluation. Subtrochanteric fracture of the proximal left femur is demonstrated, likely with extension to the greater trochanter. Evaluation is limited due to artifact. Electronically Signed   By: Lucienne Capers M.D.   On: 10/29/2015 00:17   Dg Femur Min 2 Views Left  Result Date: 10/28/2015 CLINICAL DATA:  Difficulty bearing weight on LEFT leg during  dinner today. Fall 1 day prior. EXAM: PELVIS - 1-2 VIEW; LEFT FEMUR 2 VIEWS COMPARISON:  None. FINDINGS: Acute mildly displaced fracture of the proximal femur at the level of the mid non cemented stem, status post LEFT hip hemiarthroplasty with intact well-seated hardware, no periprosthetic lucency. Osteopenia. No destructive bony lesions. Coarse calcification in the pelvis compatible with involuted fibroid. Partially imaged RIGHT hip hemiarthroplasty with cemented stem. Phleboliths in the pelvis. IMPRESSION: Acute minimally displaced proximal femur fracture of the level of the mid stem, status post LEFT hip hemiarthroplasty. No dislocation. Electronically Signed   By: Elon Alas M.D.   On: 10/28/2015 22:48    Assessment/Plan:     Active Problems:   Closed left hip fracture [facility])  I spoke with the patient's daughter Lattie Corns by phone this evening. I discussed the CT results with her.  She understands the fracture of the left hip extends across the proximal femur around her prosthesis. This is considered periprosthetic fracture. She understands the treatment options are nonoperative management with observation and nonweightbearing on the left lower extremity versus operative intervention. Patient would likely need referral to an arthroplasty specialist for definitive management if surgery is selected. Patient's daughter will continue to consider her options. For now we are going to proceed with nonoperative management. Patient will remain nonweightbearing on left lower extremity. She may get up with physical therapy using a walker. Would like physical therapy to assess for safety. I anticipate the patient is a significant fall risk and would likely benefit from skilled nursing facility. I will follow up with the patient after PT session tomorrow. Patient's daughter understood and agreed with this plan. I will touch base with her tomorrow to confirm her wishes for treatment plan. Continue  current pain management.    Thornton Park , MD 10/29/2015, 5:31 PM

## 2015-10-29 NOTE — ED Notes (Signed)
Pt transported to room 154 

## 2015-10-29 NOTE — NC FL2 (Deleted)
[place] LEVEL OF CARE SCREENING TOOL     IDENTIFICATION  Patient Name: ADALINN CROCHET Birthdate: [birth year] Sex: female Admission Date (Current Location): 10/28/2015  [facility] and [place] Number:      Facility and Address:         Provider Number:    Attending Physician Name and Address:  Thornton Park, MD  Relative Name and Phone Number:       Current Level of Care:   Recommended Level of Care:   Prior Approval Number:    Date Approved/Denied:   PASRR Number:    Discharge Plan:      Current Diagnoses: Patient Active Problem List   Diagnosis Date Noted  . Closed left hip fracture ([place]) 10/28/2015  . Closed right hip fracture ([place]) 04/07/2015    Orientation RESPIRATION BLADDER Height & Weight            Weight: 130 lb (59 kg) Height:  5\' 5"  (165.1 cm)  BEHAVIORAL SYMPTOMS/MOOD NEUROLOGICAL BOWEL NUTRITION STATUS           AMBULATORY STATUS COMMUNICATION OF NEEDS Skin                               Personal Care Assistance Level of Assistance              Functional Limitations Info             SPECIAL CARE FACTORS FREQUENCY                       Contractures      Additional Factors Info                  Current Medications (10/29/2015):  This is the current hospital active medication list Current Facility-Administered Medications  Medication Dose Route Frequency Provider Last Rate Last Dose  . 0.9 %  sodium chloride infusion   Intravenous Continuous Thornton Park, MD 50 mL/hr at 10/29/15 0118    . acetaminophen (TYLENOL) tablet 650 mg  650 mg Oral Q6H PRN Thornton Park, MD   325 mg at 10/29/15 0129  . bisacodyl (DULCOLAX) EC tablet 5 mg  5 mg Oral Daily PRN Thornton Park, MD      . docusate sodium (COLACE) capsule 100 mg  100 mg Oral BID Thornton Park, MD      . heparin injection 5,000 Units  5,000 Units Subcutaneous Q8H Thornton Park, MD   5,000 Units at 10/29/15 0537  .  HYDROcodone-acetaminophen (NORCO/VICODIN) 5-325 MG per tablet 1-2 tablet  1-2 tablet Oral Q6H PRN Thornton Park, MD      . magnesium citrate solution 1 Bottle  1 Bottle Oral Once PRN Thornton Park, MD      . magnesium hydroxide (MILK OF MAGNESIA) suspension 30 mL  30 mL Oral Daily PRN Thornton Park, MD      . methocarbamol (ROBAXIN) tablet 500 mg  500 mg Oral Q6H PRN Thornton Park, MD       Or  . methocarbamol (ROBAXIN) 500 mg in dextrose 5 % 50 mL IVPB  500 mg Intravenous Q6H PRN Thornton Park, MD      . morphine 2 MG/ML injection 2 mg  2 mg Intravenous Q2H PRN Thornton Park, MD         Discharge Medications: Please see discharge summary for a list of discharge medications.  Relevant Imaging Results:  Relevant Lab Results:  Additional Information  SS [id number]  Zettie Pho, LCSW

## 2015-10-29 NOTE — Clinical Social Work Note (Signed)
Clinical Social Work Assessment  Patient Details  Name: Anita Powell MRN: [id number] Date of Birth: [birth year]  Date of referral:  10/29/15               Reason for consult:  Facility Placement                Permission sought to share information with:  Family Supports Permission granted to share information::  Yes, Release of Information Signed  Name::      Lattie Corns)  Agency::     Relationship::  Daughter  Contact Information:  [phone]  Housing/Transportation Living arrangements for the past 2 months:  [place] of Information:  Adult Children Patient Interpreter Needed:  None Criminal Activity/Legal Involvement Pertinent to Current Situation/Hospitalization:  No - Comment as needed Significant Relationships:  Adult Children Lives with:  Facility Resident Do you feel safe going back to the place where you live?  Yes Need for family participation in patient care:  Yes (Comment)  Care giving concerns:  Possible SNF placement   Social Worker assessment / plan:  Patient is alert and oriented x1. Patient's daughter answered all assessment information via telephone.  At baseline, patient is intermittently incontinent and has recently had multiple falls when toileting. Patient is able to dress and feed independently and has limited assistance with bathing. Patient has a WC at the facility, but often forgets to use it and tries to walk.  [name] gave verbal permission to begin referral process for SNF placement for short-term rehab. Patient has used [place], [place] and prefers to return. [name] was able to verbalize in her own terms the potential barriers to dc to [place] and gave permission to refer to local agencies as a back-up.  Patient has PASRR [id number].  Employment status:  Retired Forensic scientist:  Medicare PT Recommendations:  Not assessed at this time [place] / Referral to community resources:  [place]  Patient/Family's Response to care:  Family showed gratitude for CSW involvement.  Patient/Family's Understanding of and Emotional Response to Diagnosis, Current Treatment, and Prognosis:  Family member able to verbalize in her own terms the current and potential treatment and dc plans.  Emotional Assessment Appearance:  Appears stated age Attitude/Demeanor/Rapport:   (Patient is pleasant but has dementia. Daughter is appropriately involved and pleasant.) Affect (typically observed):  Happy Orientation:  Oriented to Self Alcohol / Substance use:  Never Used Psych involvement (Current and /or in the community):  No (Comment)  Discharge Needs  Concerns to be addressed:  Discharge Planning Concerns Readmission within the last 30 days:  No Current discharge risk:  None Barriers to Discharge:  Continued Medical Work up   [employer], LCSW 10/29/2015, 10:02 AM

## 2015-10-29 NOTE — Progress Notes (Signed)
PT Cancellation Note  Patient Details Name: Anita Powell MRN: [id number] DOB: 03/26/1926   Cancelled Treatment:     Chart reviewed, spoke with pt earlier this AM. She was pleasantly confused and interested in working with PT (apparently she has had more confusion t/o the day per nursing).  Pt currently with [facility] orders and has fx in hip and we are awaiting ortho review as to prospects of surgical vs non-surgical options.  Will defer PT services today per further ortho status.   Kreg Shropshire, DPT 10/29/2015, 4:12 PM

## 2015-10-30 NOTE — Evaluation (Signed)
Physical Therapy Evaluation Patient Details Name: Anita Powell MRN: [id number] DOB: Nov 19, 1926 Today's Date: 10/30/2015   History of Present Illness  Pt admitted for complaints of L hip pain after fall and now is s/p L nondisplaced periprosthetic fracture. Pt and family currently pending decision about Sx vs non-operative treatment. Pt with history of R THR in 04/2015 and L hip hemi-arthroplasty on 2013.   Clinical Impression  Pt is a pleasantly confused 82 year old female who was admitted for L nondisplaced periprosthetic fracture. Per ortho consult, requesting PT to assess prior to determining need for surgical intervention. Pt performs bed mobility/transfers with mod assist +2. Secondary to confusion, she is unable to perform correct WB status at this time. Attempt made to transfer to [facility], however pt then soiled diaper prior to transfer. Pt returned back to bed and RN called. Pt demonstrates deficits with strength/mobility/pain. Would benefit from skilled PT to address above deficits and promote optimal return to PLOF; recommend transition to STR upon discharge from acute hospitalization.       Follow Up Recommendations SNF    Equipment Recommendations       Recommendations for Other Services       Precautions / Restrictions Precautions Precautions: Fall Restrictions Weight Bearing Restrictions: Yes LLE Weight Bearing: Non weight bearing      Mobility  Bed Mobility Overal bed mobility: Needs Assistance Bed Mobility: Supine to Sit     Supine to sit: Mod assist     General bed mobility comments: assist for bed mobility. Pt partially out of bed upon arrival, however needs assist for upright trunk support. Once seated at EOB, pt able to sit with cga. Pt with slight leaning towards R side away from pain.  Transfers Overall transfer level: Needs assistance Equipment used: 2 person hand held assist Transfers: Sit to/from Stand Sit to Stand: Mod assist;+2 physical  assistance         General transfer comment: Attempted transfer with RW, however pt unable to follow complete directions. 2nd attempt performed with HHA, with pt able to acheive upright stance. Pt unable to perform correct WB status, therefore further mobility attempts deferred.  Ambulation/Gait             General Gait Details: uanble at this time  Financial trader Rankin (Stroke Patients Only)       Balance Overall balance assessment: History of Falls;Needs assistance Sitting-balance support: Feet supported Sitting balance-Leahy Scale: Fair     Standing balance support: Bilateral upper extremity supported Standing balance-Leahy Scale: Poor                               Pertinent Vitals/Pain Pain Assessment: Faces Faces Pain Scale: Hurts little more Pain Location: L hip Pain Descriptors / Indicators: Discomfort;Dull Pain Intervention(s): Repositioned;Limited activity within patient's tolerance    Home Living Family/patient expects to be discharged to:: Assisted living               Home Equipment:  (TBD)      Prior Function           Comments: Secondary to confusion, pt unable to report PLOF. No family in room to assess. [facility].     Hand Dominance        Extremity/Trunk Assessment   Upper Extremity Assessment: Overall WFL for tasks assessed  Lower Extremity Assessment: Generalized weakness (L LE grossly 3/5; R LE grossly 3+/5)         Communication   Communication: No difficulties  Cognition Arousal/Alertness: Awake/alert Behavior During Therapy: Restless Overall Cognitive Status: Difficult to assess                      General Comments      Exercises        Assessment/Plan    PT Assessment Patient needs continued PT services  PT Diagnosis Difficulty walking;Abnormality of gait;Generalized weakness;Acute pain   PT Problem List  Decreased strength;Decreased balance;Decreased mobility;Pain  PT Treatment Interventions DME instruction;Gait training;Therapeutic exercise   PT Goals (Current goals can be found in the Care Plan section) Acute Rehab PT Goals Patient Stated Goal: unable to state PT Goal Formulation: With patient Time For Goal Achievement: 11/13/15 Potential to Achieve Goals: Fair    Frequency 7X/week   Barriers to discharge        Co-evaluation               End of Session Equipment Utilized During Treatment: Gait belt Activity Tolerance: Patient tolerated treatment well Patient left: in bed;with bed alarm set Nurse Communication: Mobility status         Time: [id number] PT Time Calculation (min) (ACUTE ONLY): 14 min   Charges:   PT Evaluation $PT Eval Moderate Complexity: 1 Procedure     PT G Codes:        Moriyah Byington 11-26-15, 10:04 AM  Greggory Stallion, PT, DPT [phone]

## 2015-10-30 NOTE — NC FL2 (Signed)
[place] LEVEL OF CARE SCREENING TOOL     IDENTIFICATION  Patient Name: Anita Powell Birthdate: [birth year] Sex: female Admission Date (Current Location): 10/28/2015  [place] and [place] Number:  Manufacturing engineer and Address:  [facility], [address], [place], [place] [address]      Provider Number: [id number]  Attending Physician Name and Address:  Thornton Park, MD  Relative Name and Phone Number:       Current Level of Care: Hospital Recommended Level of Care: [place] Prior Approval Number:    Date Approved/Denied:   PASRR Number:  ( [id number] )  Discharge Plan: SNF    Current Diagnoses: Patient Active Problem List   Diagnosis Date Noted  . Closed left hip fracture ([place]) 10/28/2015  . Closed right hip fracture ([place]) 04/07/2015    Orientation RESPIRATION BLADDER Height & Weight     Self, Time, Situation, Place  Normal Incontinent Weight: 130 lb (59 kg) Height:  5\' 5"  (165.1 cm)  BEHAVIORAL SYMPTOMS/MOOD NEUROLOGICAL BOWEL NUTRITION STATUS   (none )  (none) Incontinent Diet (Diet: Regular )  AMBULATORY STATUS COMMUNICATION OF NEEDS Skin   Extensive Assist Verbally Normal                       Personal Care Assistance Level of Assistance  Bathing, Feeding, Dressing Bathing Assistance: Limited assistance Feeding assistance: Independent Dressing Assistance: Limited assistance     Functional Limitations Info  Sight, Hearing, Speech Sight Info: Adequate Hearing Info: Adequate Speech Info: Adequate    SPECIAL CARE FACTORS FREQUENCY  PT (By licensed PT), OT (By licensed OT)     PT Frequency:  (5) OT Frequency:  (5)            Contractures      Additional Factors Info  Code Status, Allergies Code Status Info:  (Full Code. ) Allergies Info:  (No Known Allergies. )           Current Medications (10/30/2015):  This is the current hospital active medication list Current  Facility-Administered Medications  Medication Dose Route Frequency Provider Last Rate Last Dose  . 0.9 %  sodium chloride infusion   Intravenous Continuous Thornton Park, MD 50 mL/hr at 10/29/15 2128    . acetaminophen (TYLENOL) tablet 650 mg  650 mg Oral Q6H PRN Thornton Park, MD   650 mg at 10/29/15 1126  . bisacodyl (DULCOLAX) EC tablet 5 mg  5 mg Oral Daily PRN Thornton Park, MD      . docusate sodium (COLACE) capsule 100 mg  100 mg Oral BID Thornton Park, MD   100 mg at 10/30/15 0930  . heparin injection 5,000 Units  5,000 Units Subcutaneous Q8H Thornton Park, MD   5,000 Units at 10/30/15 [phone]  . HYDROcodone-acetaminophen (NORCO/VICODIN) 5-325 MG per tablet 1-2 tablet  1-2 tablet Oral Q6H PRN Thornton Park, MD      . magnesium citrate solution 1 Bottle  1 Bottle Oral Once PRN Thornton Park, MD      . magnesium hydroxide (MILK OF MAGNESIA) suspension 30 mL  30 mL Oral Daily PRN Thornton Park, MD      . methocarbamol (ROBAXIN) tablet 500 mg  500 mg Oral Q6H PRN Thornton Park, MD       Or  . methocarbamol (ROBAXIN) 500 mg in dextrose 5 % 50 mL IVPB  500 mg Intravenous Q6H PRN Thornton Park, MD      . morphine  2 MG/ML injection 2 mg  2 mg Intravenous Q2H PRN Thornton Park, MD         Discharge Medications: Please see discharge summary for a list of discharge medications.  Relevant Imaging Results:  Relevant Lab Results:   Additional Information  (SSN: [id number])  Nancyann Cotterman, Veronia Beets, LCSW

## 2015-10-30 NOTE — Progress Notes (Signed)
Subjective:  Hospital day #2  patient seems confused. Patient reports left hip pain as mild to moderate depending on hip movement.    Objective:   VITALS:   Vitals:   10/29/15 1545 10/29/15 1939 10/30/15 0521 10/30/15 0747  BP: 128/61 (!) 127/56 (!) 163/73 129/65  Pulse: 66 70 65 74  Resp: 20 18 18 18   Temp: 97.8 F (36.6 C) 97.9 F (36.6 C) 98 F (36.7 C) 98.1 F (36.7 C)  TempSrc: Oral Oral Oral Oral  SpO2: 93% 96% 95% 98%  Weight:      Height:        PHYSICAL EXAM:  Left lower extremity: Patient's skin overlying the left hip remains intact. There is no erythema or ecchymosis. Patient has no significant hip swelling. Her thigh and leg compartments are soft and compressible. She has no calf tenderness or lower leg edema. She has palpable pedal pulses, intact sensation light touch and intact motor function distally.   LABS  No results found for this or any previous visit (from the past 24 hour(s)).  Dg Pelvis 1-2 Views  Result Date: 10/28/2015 CLINICAL DATA:  Difficulty bearing weight on LEFT leg during dinner today. Fall 1 day prior. EXAM: PELVIS - 1-2 VIEW; LEFT FEMUR 2 VIEWS COMPARISON:  None. FINDINGS: Acute mildly displaced fracture of the proximal femur at the level of the mid non cemented stem, status post LEFT hip hemiarthroplasty with intact well-seated hardware, no periprosthetic lucency. Osteopenia. No destructive bony lesions. Coarse calcification in the pelvis compatible with involuted fibroid. Partially imaged RIGHT hip hemiarthroplasty with cemented stem. Phleboliths in the pelvis. IMPRESSION: Acute minimally displaced proximal femur fracture of the level of the mid stem, status post LEFT hip hemiarthroplasty. No dislocation. Electronically Signed   By: Elon Alas M.D.   On: 10/28/2015 22:48   Ct Head Wo Contrast  Result Date: 10/28/2015 CLINICAL DATA:  Post unwitnessed fall with confusion. EXAM: CT HEAD WITHOUT CONTRAST TECHNIQUE: Contiguous axial images  were obtained from the base of the skull through the vertex without intravenous contrast. COMPARISON:  None. FINDINGS: Brain: No evidence of acute infarction, hemorrhage, hydrocephalus, extra-axial collection or mass lesion/mass effect. There is marked brain parenchymal atrophy and microvascular angiopathy. Vascular: Calcific atherosclerotic disease of the intracranial vessels of the skull base. Skull: No displaced fractures. Sinuses/Orbits: No acute finding. Other: None. IMPRESSION: No acute intracranial abnormality. Marked brain parenchymal atrophy and chronic microvascular disease. Electronically Signed   By: Fidela Salisbury M.D.   On: 10/28/2015 21:52   Ct Hip Left Wo Contrast  Result Date: 10/29/2015 CLINICAL DATA:  Fall. Evaluate known left femur fracture for surgical planning. EXAM: CT OF THE LEFT HIP WITHOUT CONTRAST TECHNIQUE: Multidetector CT imaging of the left hip was performed according to the standard protocol. Multiplanar CT image reconstructions were also generated. COMPARISON:  Left hip 10/28/2015 FINDINGS: Bones/Joint/Cartilage Left hip hemiarthroplasty with non cemented components. Component appears well seated although artifact from the metallic hardware obscures visualization. No acetabular dislocation. A minimally displaced sub trochanteric fracture is demonstrated in the proximal left femur. The fracture line appears to also extend to the greater trochanter. Because of the artifact, the fracture is probably better seen on plain films. There is evidence of cortical disruption and linear lucency corresponding to the fracture. Ligaments Suboptimally assessed by CT. Muscles and Tendons No muscular hematoma or infiltration demonstrated. Soft tissues No significant abnormalities demonstrated. IMPRESSION: Left hip hemiarthroplasty without dislocation. Artifact from the arthroplasty limits evaluation. Subtrochanteric fracture of the proximal left femur  is demonstrated, likely with extension to  the greater trochanter. Evaluation is limited due to artifact. Electronically Signed   By: Lucienne Capers M.D.   On: 10/29/2015 00:17   Dg Femur Min 2 Views Left  Result Date: 10/28/2015 CLINICAL DATA:  Difficulty bearing weight on LEFT leg during dinner today. Fall 1 day prior. EXAM: PELVIS - 1-2 VIEW; LEFT FEMUR 2 VIEWS COMPARISON:  None. FINDINGS: Acute mildly displaced fracture of the proximal femur at the level of the mid non cemented stem, status post LEFT hip hemiarthroplasty with intact well-seated hardware, no periprosthetic lucency. Osteopenia. No destructive bony lesions. Coarse calcification in the pelvis compatible with involuted fibroid. Partially imaged RIGHT hip hemiarthroplasty with cemented stem. Phleboliths in the pelvis. IMPRESSION: Acute minimally displaced proximal femur fracture of the level of the mid stem, status post LEFT hip hemiarthroplasty. No dislocation. Electronically Signed   By: Elon Alas M.D.   On: 10/28/2015 22:48    Assessment/Plan:     Active Problems:   Closed left hip fracture [facility])  Patient is stable. Patient has a nondisplaced. Prosthetic fracture around her left hip hemiarthroplasty. After discussion with the patient's daughter we have elected to treat this nonoperatively. Patient is due to go to skilled nursing facility in [facility] tomorrow. Patient will be non-weightbearing on the left lower extremity.    Thornton Park , MD 10/30/2015, 3:50 PM

## 2015-10-30 NOTE — Progress Notes (Addendum)
Clinical Education officer, museum (CSW) contacted patient's daughter [name] and presented bed offers. Daughter chose [facility] in [place], [place]. Robin admissions coordinator at [facility] is aware of accepted bed offer and reported that patient has a new 100 SNF days to use because she discharged from [facility] in April 2017 and has had a 60 day wellness period. CSW dicussed transport options with daughter. Per daughter [facility] has transported her once and family has transported her. Per Shirlean Mylar she would like EMS if possible. CSW contacted [facility] and made them aware of above. CSW is waiting on a call back from [facility].   [facility] Chief Lennette Bihari called CSW back and reported that [facility] can transport patient to [place], [place] tomorrow and will need it to be called in as soon as possible because they will not be able to do it after 4 or 5 pm. Per Lennette Bihari no money is required up front for transport. CSW contacted patient's daughter [name] and made her aware of above.   [employer], LCSW [phone]

## 2015-10-31 NOTE — Progress Notes (Signed)
  Subjective:  HD #3 Patient reports left hip pain as mild.  She has no complaints. Patient is confused.  Objective:   VITALS:   Vitals:   10/30/15 0747 10/30/15 1925 10/31/15 0422 10/31/15 0800  BP: 129/65 (!) 114/99 (!) 142/71 (!) 155/78  Pulse: 74 82 76 73  Resp: 18 18 18    Temp: 98.1 F (36.7 C) 98.2 F (36.8 C) 98.6 F (37 C) 97.7 F (36.5 C)  TempSrc: Oral Oral  Oral  SpO2: 98% 95% 93% 95%  Weight:      Height:        PHYSICAL EXAM:  Left extremity: Patient's skin is intact overlying the left hip. There is no erythema and only mild ecchymosis. She has minimal tenderness to palpation. She has minimal pain with logrolling. Patient can actively flex and extend her knee. She can flex and extend her toes and dorsiflex and plantarflex her ankle. She has intact sensation light touch throughout the left lower extremity and palpable pedal pulses.   LABS  No results found for this or any previous visit (from the past 24 hour(s)).  No results found.  Assessment/Plan:     Active Problems:   Closed left hip fracture [facility])  Patient is doing well. She has minimal left hip pain. She is going to be transferred to skilled nursing facility today. She will remain nonweightbearing on the left lower extremity. She will follow-up in my office in 2 weeks if possible. Patient is going to a rehabilitation in [place] and the family may wish to have her follow-up with the orthopedic surgeon locally if she is unable to easily travel in a car.  Patient has been on subcutaneous heparin but I'm going to discharge her on Lovenox 40 mg daily while in rehabilitation.    Thornton Park , MD 10/31/2015, 12:09 PM

## 2015-10-31 NOTE — Care Management Important Message (Signed)
Important Message  Patient Details  Name: Anita Powell MRN: [id number] Date of Birth: June 08, 1926   Medicare Important Message Given:  N/A - LOS <3 / Initial given by admissions    [name], RN 10/31/2015, 2:16 PM

## 2015-10-31 NOTE — Clinical Social Work Placement (Signed)
   CLINICAL SOCIAL WORK PLACEMENT  NOTE  Date:  10/31/2015  Patient Details  Name: Anita Powell MRN: [id number] Date of Birth: [birth year]  Clinical Social Work is seeking post-discharge placement for this patient at the [place] level of care (*CSW will initial, date and re-position this form in  chart as items are completed):  Yes   Patient/family provided with [facility] Work Department's list of facilities offering this level of care within the geographic area requested by the patient (or if unable, by the patient's family).  Yes   Patient/family informed of their freedom to choose among providers that offer the needed level of care, that participate in Medicare, Medicaid or managed care program needed by the patient, have an available bed and are willing to accept the patient.  Yes   Patient/family informed of [facility]'s ownership interest in [facility] and [facility], as well as of the fact that they are under no obligation to receive care at these facilities.  PASRR submitted to EDS on       PASRR number received on       Existing PASRR number confirmed on 10/29/15     FL2 transmitted to all facilities in geographic area requested by pt/family on 10/30/15     FL2 transmitted to all facilities within larger geographic area on       Patient informed that his/her managed care company has contracts with or will negotiate with certain facilities, including the following:        Yes   Patient/family informed of bed offers received.  Patient chooses bed at  [id number] [place], [place]) )     Physician recommends and patient chooses bed at      Patient to be transferred to  [facility]) on 10/31/15.  Patient to be transferred to facility by  [facility] )     Patient family notified on 10/31/15 of transfer.  Name of family member notified:   (Patient's daughter [name] is aware of D/C today. )     PHYSICIAN        Additional Comment:    _______________________________________________ Cage Gupton, Veronia Beets, LCSW 10/31/2015, 1:33 PM

## 2015-10-31 NOTE — Evaluation (Signed)
Occupational Therapy Evaluation Patient Details Name: Anita Powell MRN: [id number] DOB: 20-Mar-1926 Today's Date: 10/31/2015    History of Present Illness Pt was admitted for complaints of L hip pain and is s/p L nondisplaced periprosthetic fracture. Pt is non-operative intervention. Pt with history of R THR in 04/2015 and L hip hemi-arthroplasty on 2013.    Clinical Impression   Pt. Is a 82 y.o. female who was admitted to [facility] for nonoperative intervention following a Left Fracture, Pt. Presents with pain, limited ROM, weakness, limited cognition, and limited activity tolerance which hinder her ability to complete ADL tasks. Pt. Could benefit from skilled OT services to review adaptive equipment training for LE ADLs, review work simplification techniques, and to improve ADLfunctioning and work towards returning to her PLOF.    Follow Up Recommendations  No OT follow up    Equipment Recommendations       Recommendations for Other Services Rehab consult     Precautions / Restrictions Precautions Precautions: Fall Restrictions Weight Bearing Restrictions: Yes LLE Weight Bearing: Non weight bearing                                                     ADL Overall ADL's : Needs assistance/impaired Eating/Feeding: Set up   Grooming: Set up               Lower Body Dressing: Total assistance                 General ADL Comments: Pt. education was provided about general A/E use for LE dressing.     Vision     Perception     Praxis      Pertinent Vitals/Pain Pain Assessment: 0-10 Pain Score: 8  Faces Pain Scale: (P) Hurts even more (Pt would yell at when performing log roll) Pain Location: Left Hip  Pain Descriptors / Indicators: Discomfort;Dull Pain Intervention(s): Repositioned;Limited activity within patient's tolerance     Hand Dominance Right   Extremity/Trunk Assessment Upper Extremity Assessment Upper Extremity  Assessment: Overall WFL for tasks assessed           Communication Communication Communication: No difficulties   Cognition Arousal/Alertness: Awake/alert Behavior During Therapy: Flat affect Overall Cognitive Status: Difficult to assess                     General Comments       Exercises      Shoulder Instructions      Home Living Family/patient expects to be discharged to:: Assisted living                                        Prior Functioning/Environment Level of Independence: Independent             OT Diagnosis: Generalized weakness;Acute pain   OT Problem List: Decreased strength;Decreased safety awareness;Decreased knowledge of use of DME or AE;Pain;Decreased activity tolerance   OT Treatment/Interventions: Self-care/ADL training;Therapeutic exercise;Therapeutic activities;DME and/or AE instruction    OT Goals(Current goals can be found in the care plan section) Acute Rehab OT Goals Patient Stated Goal: Unable to state OT Goal Formulation: With patient Potential to Achieve Goals: Good  OT Frequency: Min 1X/week   Barriers to D/C:  Co-evaluation              End of Session    Activity Tolerance: Patient tolerated treatment well Patient left: in bed;with call bell/phone within reach   Time: 1150-1205 OT Time Calculation (min): 15 min Charges:  OT General Charges $OT Visit: 1 Procedure OT Evaluation $OT Eval Moderate Complexity: 1 Procedure G-Codes:    Harrel Carina November 23, 2015, 12:27 PM

## 2015-10-31 NOTE — Progress Notes (Signed)
Physical Therapy Treatment Patient Details Name: Anita Powell MRN: [id number] DOB: 05/25/1926 Today's Date: 10/31/2015    History of Present Illness Pt was admitted for complaints of L hip pain and is s/p L nondisplaced periprosthetic fracture. Pt is non-operative intervention. Pt with history of R THR in 04/2015 and L hip hemi-arthroplasty on 2013.     PT Comments     Pt presented in bed with episode of incontinence. PTA assisted nsg with cleaning pt. Pt requiring mod/max cues for log rolling technique and PTA provided max A for positioning to assist with cleaning. Pt then able to tolerate supine therex with mod cues for technique and able to perform AROM/AAROM as noted below.  Pt able to tolerate therex with min c/o increased pain.  Pt left in bed with alarm on and all current needs met.   Follow Up Recommendations  SNF     Equipment Recommendations       Recommendations for Other Services       Precautions / Restrictions Precautions Precautions: Fall Restrictions Weight Bearing Restrictions: Yes LLE Weight Bearing: Non weight bearing    Mobility  Bed Mobility Overal bed mobility: Needs Assistance Bed Mobility: Rolling Rolling: Mod assist         General bed mobility comments: Pt required mod/max cues for use of bed rails and sequencing.   Transfers                 General transfer comment: NT  Ambulation/Gait             General Gait Details: unable    Stairs            Wheelchair Mobility    Modified Rankin (Stroke Patients Only)       Balance                                    Cognition Arousal/Alertness: Awake/alert Behavior During Therapy: Flat affect Overall Cognitive Status: Difficult to assess                      Exercises Other Exercises Other Exercises: AROM heel slides, AAROM hip abd/add, ankle pumps, SAQ x 10      General Comments        Pertinent Vitals/Pain Pain Assessment:  0-10 Pain Score: 8  Faces Pain Scale: Hurts even more (Pt would yell at when performing log roll) Pain Location: Left Hip  Pain Descriptors / Indicators: Discomfort;Dull Pain Intervention(s): Repositioned;Limited activity within patient's tolerance    Home Living Family/patient expects to be discharged to:: Assisted living                    Prior Function Level of Independence: Independent          PT Goals (current goals can now be found in the care plan section) Acute Rehab PT Goals Patient Stated Goal: Unable to state    Frequency  7X/week    PT Plan      Co-evaluation             End of Session   Activity Tolerance: Patient tolerated treatment well Patient left: in bed;with bed alarm set     Time: 1040-1115 PT Time Calculation (min) (ACUTE ONLY): 35 min  Charges:  $Therapeutic Exercise: 8-22 mins $Therapeutic Activity: 8-22 mins  G Codes:      Jeshurun Oaxaca 2015/11/20, 12:37 PM  Caeli Linehan, PTA

## 2015-10-31 NOTE — Progress Notes (Signed)
Patient was discharged to [facility] in [place], [place] via EMS. VSS. IV removed with cath intact. Paperwork and belongings sent with EMS.  Report called.

## 2015-10-31 NOTE — Care Management Note (Signed)
Case Management Note  Patient Details  Name: Anita Powell MRN: [id number] Date of Birth: [birth year]  Subjective/Objective:                    Action/Plan: Discharge plan is for SNF at [facility]. CSW is following case. Will sign off.   Expected Discharge Date:                  Expected Discharge Plan:  Skilled Nursing Facility  In-House Referral:  Clinical Social Work  Discharge planning Services  CM Consult  Post Acute Care Choice:    Choice offered to:     DME Arranged:    DME Agency:     HH Arranged:    [place] Agency:     Status of Service:  Completed, signed off  If discussed at [employer], dates discussed:    Additional Comments:  [name], RN 10/31/2015, 11:35 AM

## 2015-10-31 NOTE — Progress Notes (Signed)
Patient is medically stable for D/C to [facility] in [place], [place] today. Per [facility] admissions coordinator at [facility] patient is going to room 25-B. RN will call report to [employer] at [phone] and arrange EMS for transport. Clinical Education officer, museum (CSW) sent D/C orders to [facility] via [place] today. Patient is aware of above. CSW contacted patient's daughter [name] and made her aware of above. Please reconsult if future social work needs arise. CSW signing off.   [employer], LCSW [phone]

## 2015-10-31 NOTE — Discharge Summary (Signed)
Physician Discharge Summary  Patient ID: Anita Powell MRN: [id number] DOB/AGE: 1926/10/08 82 y.o.  Admit date: 10/28/2015 Discharge date: 10/31/2015  Admission Diagnoses:  Left hip non-displaced periprosthetic fracture  Discharge Diagnoses:  Left hip non-displaced periprosthetic fracture  Active Problems:   Closed left hip fracture [facility])   Past Medical History:  Diagnosis Date  . Breast cancer ([place]) 2001   radiation  . Dementia   . Hypertension   . Hypothyroidism     Surgeries:  on    Consultants (if any):   Discharged Condition: Improved  Hospital Course: [name] is an 82 y.o. female who was admitted 10/28/2015 with a diagnosis of Left hip non-displaced periprosthetic fracture.  This was confirmed by CT.  After careful Review of the CT scan, orthopedic surgery recommended nonoperative management. Patient's daughter understood and agreed with this plan. The patient has dementia and was not able to make treatment decisions.    She was given perioperative antibiotics:  Anti-infectives    None    . Patient was made nonweightbearing on her left lower extremity.   Patient was evaluated by physical therapy. It was recommended by physical therapy that she go to a skilled nursing facility. Patient had no complications during her hospitalization.  Patient was doing well and accepted to a skilled nursing facility on hospital day #3.   She was given sequential compression devices, early ambulation, and Heparin for DVT prophylaxis.  She benefited maximally from the hospital stay and there were no complications.    Recent vital signs:  Vitals:   10/31/15 0422 10/31/15 0800  BP: (!) 142/71 (!) 155/78  Pulse: 76 73  Resp: 18   Temp: 98.6 F (37 C) 97.7 F (36.5 C)    Recent laboratory studies:  Lab Results  Component Value Date   HGB 13.2 10/29/2015   HGB 13.8 04/11/2015   HGB 13.4 04/10/2015   Lab Results  Component Value Date   WBC 8.8 10/29/2015   PLT  180 10/29/2015   Lab Results  Component Value Date   INR 1.09 04/08/2015   Lab Results  Component Value Date   NA 137 10/29/2015   K 3.0 (L) 10/29/2015   CL 102 10/29/2015   CO2 29 10/29/2015   BUN 16 10/29/2015   CREATININE 0.69 10/29/2015   GLUCOSE 118 (H) 10/29/2015    Discharge Medications:     Medication List    TAKE these medications   acetaminophen 325 MG tablet Commonly known as:  TYLENOL Take 2 tablets (650 mg total) by mouth every 6 (six) hours as needed for mild pain (or Fever >/= 101).   aspirin EC 81 MG tablet Take 81 mg by mouth at bedtime.   cholecalciferol 1000 units tablet Commonly known as:  VITAMIN D Take 2,000 Units by mouth at bedtime.   enoxaparin 40 MG/0.4ML injection Commonly known as:  LOVENOX Inject 0.4 mLs (40 mg total) into the skin daily.   levothyroxine 137 MCG tablet Commonly known as:  SYNTHROID, LEVOTHROID Take 137 mcg by mouth daily before breakfast.   memantine 10 MG tablet Commonly known as:  NAMENDA Take 10 mg by mouth 2 (two) times daily.   metoprolol succinate 50 MG 24 hr tablet Commonly known as:  TOPROL-XL Take 50 mg by mouth daily. Take with or immediately following a meal.   omeprazole 20 MG capsule Commonly known as:  PRILOSEC Take 20 mg by mouth daily.   QUEtiapine 25 MG tablet Commonly known as:  SEROQUEL  Take 1 tablet (25 mg total) by mouth at bedtime.       Diagnostic Studies: Dg Pelvis 1-2 Views  Result Date: 10/28/2015 CLINICAL DATA:  Difficulty bearing weight on LEFT leg during dinner today. Fall 1 day prior. EXAM: PELVIS - 1-2 VIEW; LEFT FEMUR 2 VIEWS COMPARISON:  None. FINDINGS: Acute mildly displaced fracture of the proximal femur at the level of the mid non cemented stem, status post LEFT hip hemiarthroplasty with intact well-seated hardware, no periprosthetic lucency. Osteopenia. No destructive bony lesions. Coarse calcification in the pelvis compatible with involuted fibroid. Partially imaged RIGHT  hip hemiarthroplasty with cemented stem. Phleboliths in the pelvis. IMPRESSION: Acute minimally displaced proximal femur fracture of the level of the mid stem, status post LEFT hip hemiarthroplasty. No dislocation. Electronically Signed   By: Elon Alas M.D.   On: 10/28/2015 22:48   Ct Head Wo Contrast  Result Date: 10/28/2015 CLINICAL DATA:  Post unwitnessed fall with confusion. EXAM: CT HEAD WITHOUT CONTRAST TECHNIQUE: Contiguous axial images were obtained from the base of the skull through the vertex without intravenous contrast. COMPARISON:  None. FINDINGS: Brain: No evidence of acute infarction, hemorrhage, hydrocephalus, extra-axial collection or mass lesion/mass effect. There is marked brain parenchymal atrophy and microvascular angiopathy. Vascular: Calcific atherosclerotic disease of the intracranial vessels of the skull base. Skull: No displaced fractures. Sinuses/Orbits: No acute finding. Other: None. IMPRESSION: No acute intracranial abnormality. Marked brain parenchymal atrophy and chronic microvascular disease. Electronically Signed   By: Fidela Salisbury M.D.   On: 10/28/2015 21:52   Ct Hip Left Wo Contrast  Result Date: 10/29/2015 CLINICAL DATA:  Fall. Evaluate known left femur fracture for surgical planning. EXAM: CT OF THE LEFT HIP WITHOUT CONTRAST TECHNIQUE: Multidetector CT imaging of the left hip was performed according to the standard protocol. Multiplanar CT image reconstructions were also generated. COMPARISON:  Left hip 10/28/2015 FINDINGS: Bones/Joint/Cartilage Left hip hemiarthroplasty with non cemented components. Component appears well seated although artifact from the metallic hardware obscures visualization. No acetabular dislocation. A minimally displaced sub trochanteric fracture is demonstrated in the proximal left femur. The fracture line appears to also extend to the greater trochanter. Because of the artifact, the fracture is probably better seen on plain films.  There is evidence of cortical disruption and linear lucency corresponding to the fracture. Ligaments Suboptimally assessed by CT. Muscles and Tendons No muscular hematoma or infiltration demonstrated. Soft tissues No significant abnormalities demonstrated. IMPRESSION: Left hip hemiarthroplasty without dislocation. Artifact from the arthroplasty limits evaluation. Subtrochanteric fracture of the proximal left femur is demonstrated, likely with extension to the greater trochanter. Evaluation is limited due to artifact. Electronically Signed   By: Lucienne Capers M.D.   On: 10/29/2015 00:17   Dg Femur Min 2 Views Left  Result Date: 10/28/2015 CLINICAL DATA:  Difficulty bearing weight on LEFT leg during dinner today. Fall 1 day prior. EXAM: PELVIS - 1-2 VIEW; LEFT FEMUR 2 VIEWS COMPARISON:  None. FINDINGS: Acute mildly displaced fracture of the proximal femur at the level of the mid non cemented stem, status post LEFT hip hemiarthroplasty with intact well-seated hardware, no periprosthetic lucency. Osteopenia. No destructive bony lesions. Coarse calcification in the pelvis compatible with involuted fibroid. Partially imaged RIGHT hip hemiarthroplasty with cemented stem. Phleboliths in the pelvis. IMPRESSION: Acute minimally displaced proximal femur fracture of the level of the mid stem, status post LEFT hip hemiarthroplasty. No dislocation. Electronically Signed   By: Elon Alas M.D.   On: 10/28/2015 22:48    Disposition: 03-Skilled  Nursing Facility  Discharge Instructions    Call MD / Call 911    Complete by:  As directed   If you experience chest pain or shortness of breath, CALL 911 and be transported to the hospital emergency room.  If you develope a fever above 101 F, pus (white drainage) or increased drainage or redness at the wound, or calf pain, call your surgeon's office.   Constipation Prevention    Complete by:  As directed   Drink plenty of fluids.  Prune juice may be helpful.  You may  use a stool softener, such as Colace (over the counter) 100 mg twice a day.  Use MiraLax (over the counter) for constipation as needed.   Diet - low sodium heart healthy    Complete by:  As directed   Discharge instructions    Complete by:  As directed   Patient has a left non-displaced periprosthetic hip fracture.  Treatment is going to be non-operative.  Patient is NON-WEIGHT BEARING on the left lower extremity.  She may transfer and ambulate if possible with a walker and will likely need a wheelchair for long distances.   Increase activity slowly as tolerated    Complete by:  As directed      Contact information for after-discharge care    [facility] .   Specialty:  [place] information: [address] [place] [phone]               Signed: [place] ,MD 10/31/2015, 12:19 PM

## 2016-05-05 ENCOUNTER — Emergency Department: Payer: Medicare Other

## 2016-05-05 ENCOUNTER — Encounter: Payer: Self-pay | Admitting: Emergency Medicine

## 2016-05-05 ENCOUNTER — Emergency Department
Admission: EM | Admit: 2016-05-05 | Discharge: 2016-05-05 | Disposition: A | Discharge status: Home / Self Care | Payer: Medicare Other | Attending: Emergency Medicine | Admitting: Emergency Medicine

## 2016-05-05 DIAGNOSIS — W19XXXA Unspecified fall, initial encounter: Secondary | ICD-10-CM

## 2016-05-05 DIAGNOSIS — Y999 Unspecified external cause status: Secondary | ICD-10-CM | POA: Diagnosis not present

## 2016-05-05 DIAGNOSIS — Y929 Unspecified place or not applicable: Secondary | ICD-10-CM | POA: Insufficient documentation

## 2016-05-05 DIAGNOSIS — I10 Essential (primary) hypertension: Secondary | ICD-10-CM | POA: Diagnosis not present

## 2016-05-05 DIAGNOSIS — S79911A Unspecified injury of right hip, initial encounter: Secondary | ICD-10-CM | POA: Diagnosis present

## 2016-05-05 DIAGNOSIS — F0390 Unspecified dementia without behavioral disturbance: Secondary | ICD-10-CM | POA: Insufficient documentation

## 2016-05-05 DIAGNOSIS — Z7982 Long term (current) use of aspirin: Secondary | ICD-10-CM | POA: Diagnosis not present

## 2016-05-05 DIAGNOSIS — M25551 Pain in right hip: Secondary | ICD-10-CM | POA: Diagnosis not present

## 2016-05-05 DIAGNOSIS — E039 Hypothyroidism, unspecified: Secondary | ICD-10-CM | POA: Diagnosis not present

## 2016-05-05 DIAGNOSIS — Z853 Personal history of malignant neoplasm of breast: Secondary | ICD-10-CM | POA: Insufficient documentation

## 2016-05-05 DIAGNOSIS — M25561 Pain in right knee: Secondary | ICD-10-CM | POA: Diagnosis not present

## 2016-05-05 DIAGNOSIS — Y939 Activity, unspecified: Secondary | ICD-10-CM | POA: Diagnosis not present

## 2016-05-05 DIAGNOSIS — Z79899 Other long term (current) drug therapy: Secondary | ICD-10-CM | POA: Insufficient documentation

## 2016-05-05 NOTE — ED Provider Notes (Signed)
[facility] Emergency Department Provider Note  ____________________________________________   I have reviewed the triage vital signs and the nursing notes.   HISTORY  Chief Complaint Fall    HPI Anita Powell is a 83 y.o. female who suffers from dementia and is at her baseline per family. She fell today. Complained of some pain to the r thigh afterwards. No loc. Pt cannot give further hx. Level 5 chart caveat; no further history available due to patient status.     Past Medical History:  Diagnosis Date  . Breast cancer ([place]) 2001   radiation  . Dementia   . Hypertension   . Hypothyroidism     Patient Active Problem List   Diagnosis Date Noted  . Closed left hip fracture ([place]) 10/28/2015  . Closed right hip fracture ([place]) 04/07/2015    Past Surgical History:  Procedure Laterality Date  . BREAST EXCISIONAL BIOPSY Left 2001   positive  . HIP ARTHROPLASTY Right 04/08/2015   Procedure: ARTHROPLASTY BIPOLAR HIP (HEMIARTHROPLASTY);  Surgeon: Dereck Leep, MD;  Location: [facility] ORS;  Service: Orthopedics;  Laterality: Right;  . JOINT REPLACEMENT    . Left hip hemiarthroplasty  2013    Prior to Admission medications   Medication Sig Start Date End Date Taking? Authorizing Provider  acetaminophen (TYLENOL) 325 MG tablet Take 2 tablets (650 mg total) by mouth every 6 (six) hours as needed for mild pain (or Fever >/= 101). 04/11/15   Loletha Grayer, MD  aspirin EC 81 MG tablet Take 81 mg by mouth at bedtime.    Historical Provider, MD  cholecalciferol (VITAMIN D) 1000 units tablet Take 2,000 Units by mouth at bedtime.    Historical Provider, MD  enoxaparin (LOVENOX) 40 MG/0.4ML injection Inject 0.4 mLs (40 mg total) into the skin daily. 04/11/15   Loletha Grayer, MD  levothyroxine (SYNTHROID, LEVOTHROID) 137 MCG tablet Take 137 mcg by mouth daily before breakfast.    Historical Provider, MD  memantine (NAMENDA) 10 MG tablet Take 10 mg by mouth 2 (two)  times daily.    Historical Provider, MD  metoprolol succinate (TOPROL-XL) 50 MG 24 hr tablet Take 50 mg by mouth daily. Take with or immediately following a meal.    Historical Provider, MD  omeprazole (PRILOSEC) 20 MG capsule Take 20 mg by mouth daily.    Historical Provider, MD  QUEtiapine (SEROQUEL) 25 MG tablet Take 1 tablet (25 mg total) by mouth at bedtime. 04/11/15   Loletha Grayer, MD    Allergies Patient has no known allergies.  Family History  Problem Relation Age of Onset  . Breast cancer Sister 57  . Breast cancer Paternal Aunt 69    Social History Social History  Substance Use Topics  . Smoking status: Never Smoker  . Smokeless tobacco: Never Used  . Alcohol use Yes     Comment: 3 oz scotch a day     Review of Systems Constitutional: No fever/chills Eyes: No visual changes. ENT: No sore throat. No stiff neck no neck pain Cardiovascular: Denies chest pain. Respiratory: Denies shortness of breath. Gastrointestinal:   no vomiting.  No diarrhea.  No constipation. Genitourinary: Negative for dysuria. Musculoskeletal: Negative lower extremity swelling Skin: Negative for rash. Neurological: Negative for severe headaches, focal weakness or numbness. 10-point ROS otherwise negative.  ____________________________________________   PHYSICAL EXAM:  VITAL SIGNS: ED Triage Vitals [05/05/16 1717]  Enc Vitals Group     BP (!) 143/94     Pulse Rate (!) 53  Resp      Temp 98.4 F (36.9 C)     Temp Source Oral     SpO2 95 %     Weight 138 lb 8 oz (62.8 kg)     Height 5\' 3"  (1.6 m)     Head Circumference      Peak Flow      Pain Score      Pain Loc      Pain Edu?      Excl. in [place]?     Constitutional: Alert and orientedTo self only. Well appearing and in no acute distress. Eyes: Conjunctivae are normal. PERRL. EOMI. Head: Atraumatic. Nose: No congestion/rhinnorhea. Mouth/Throat: Mucous membranes are moist.  Oropharynx non-erythematous. Neck: No stridor.    Nontender with no meningismus Cardiovascular: Normal rate, regular rhythm. Grossly normal heart sounds.  Good peripheral circulation. Respiratory: Normal respiratory effort.  No retractions. Lungs CTAB. Abdominal: Soft and nontender. No distention. No guarding no rebound Back:  There is no focal tenderness or step off.  there is no midline tenderness there are no lesions noted. there is no CVA tenderness Musculoskeletal: Are some minimal tenderness palpation in the right hip region which she does have full range of motion. No bruising no deformity noted. Also, the minimal tender to palpation the right knee but again full range of motion no bruising or deformity noted, no upper extremity tenderness. No joint effusions, no DVT signs strong distal pulses no edema Neurologic:  Normal speech and language. No gross focal neurologic deficits are appreciated.  Skin:  Skin is warm, dry and intact. No rash noted. Psychiatric: Mood and affect are normal. Speech and behavior are normal.  ____________________________________________   LABS (all labs ordered are listed, but only abnormal results are displayed)  Labs Reviewed - No data to display ____________________________________________  EKG  I personally interpreted any EKGs ordered by me or triage Sinus rhythm nonspecific ST changes no acute ST elevation no acute ST depression rate 59 ____________________________________________  RADIOLOGY  I reviewed any imaging ordered by me or triage that were performed during my shift and, if possible, patient and/or family made aware of any abnormal findings. ____________________________________________   PROCEDURES  Procedure(s) performed: None  Procedures  Critical Care performed: None  ____________________________________________   INITIAL IMPRESSION / ASSESSMENT AND PLAN / ED COURSE  Pertinent labs & imaging results that were available during my care of the patient were reviewed by me and  considered in my medical decision making (see chart for details).  She presents today complaining of having had a fall. She is at her baseline according to family he do not wish further workup. Patient has some minimal tenderness to the right lower showed me. X-rays are negative. She does not walk at baseline and therefore we cannot ambulate her. She is not complaining of pain or anything else here. We did a CT scan of her head because report recollection of the fall and there is no evidence of any acute injury there either. Back is nontender, vital signs are reassuring, we will discharge with close outpatient follow-up    ____________________________________________   FINAL CLINICAL IMPRESSION(S) / ED DIAGNOSES  Final diagnoses:  None      This chart was dictated using voice recognition software.  Despite best efforts to proofread,  errors can occur which can change meaning.      Schuyler Amor, MD 05/05/16 [phone]

## 2016-05-05 NOTE — ED Triage Notes (Signed)
Pt presents to ED from [facility] assisted living with underlying dementia c/o right knee pain r/t unwitnessed fall

## 2017-06-20 ENCOUNTER — Encounter (HOSPITAL_COMMUNITY): Payer: Self-pay | Admitting: Emergency Medicine

## 2017-06-20 ENCOUNTER — Emergency Department (HOSPITAL_COMMUNITY)
Admission: EM | Admit: 2017-06-20 | Discharge: 2017-06-21 | Disposition: A | Discharge status: Home / Self Care | Payer: Medicare Other | Attending: Emergency Medicine | Admitting: Emergency Medicine

## 2017-06-20 ENCOUNTER — Other Ambulatory Visit: Payer: Self-pay

## 2017-06-20 ENCOUNTER — Emergency Department (HOSPITAL_COMMUNITY): Payer: Medicare Other

## 2017-06-20 DIAGNOSIS — R509 Fever, unspecified: Secondary | ICD-10-CM | POA: Insufficient documentation

## 2017-06-20 DIAGNOSIS — E039 Hypothyroidism, unspecified: Secondary | ICD-10-CM | POA: Insufficient documentation

## 2017-06-20 DIAGNOSIS — F0390 Unspecified dementia without behavioral disturbance: Secondary | ICD-10-CM | POA: Diagnosis not present

## 2017-06-20 DIAGNOSIS — J029 Acute pharyngitis, unspecified: Secondary | ICD-10-CM | POA: Insufficient documentation

## 2017-06-20 DIAGNOSIS — R5383 Other fatigue: Secondary | ICD-10-CM | POA: Diagnosis present

## 2017-06-20 DIAGNOSIS — Z7982 Long term (current) use of aspirin: Secondary | ICD-10-CM | POA: Insufficient documentation

## 2017-06-20 DIAGNOSIS — Z96643 Presence of artificial hip joint, bilateral: Secondary | ICD-10-CM | POA: Diagnosis not present

## 2017-06-20 DIAGNOSIS — Z853 Personal history of malignant neoplasm of breast: Secondary | ICD-10-CM | POA: Insufficient documentation

## 2017-06-20 DIAGNOSIS — Z79899 Other long term (current) drug therapy: Secondary | ICD-10-CM | POA: Insufficient documentation

## 2017-06-20 DIAGNOSIS — I10 Essential (primary) hypertension: Secondary | ICD-10-CM | POA: Insufficient documentation

## 2017-06-20 LAB — URINALYSIS, COMPLETE (UACMP) WITH MICROSCOPIC
BACTERIA UA: NONE SEEN
Bilirubin Urine: NEGATIVE
Glucose, UA: >=500 mg/dL — AB
Hgb urine dipstick: NEGATIVE
KETONES UR: NEGATIVE mg/dL
Leukocytes, UA: NEGATIVE
Nitrite: NEGATIVE
PROTEIN: NEGATIVE mg/dL
Specific Gravity, Urine: 1.018 (ref 1.005–1.030)
Squamous Epithelial / LPF: NONE SEEN
pH: 6 (ref 5.0–8.0)

## 2017-06-20 LAB — CBC WITH DIFFERENTIAL/PLATELET
Basophils Absolute: 0 10*3/uL (ref 0.0–0.1)
Basophils Relative: 0 %
EOS PCT: 0 %
Eosinophils Absolute: 0 10*3/uL (ref 0.0–0.7)
HCT: 41.4 % (ref 36.0–46.0)
HEMOGLOBIN: 13.8 g/dL (ref 12.0–15.0)
Lymphocytes Relative: 8 %
Lymphs Abs: 1 10*3/uL (ref 0.7–4.0)
MCH: 29.6 pg (ref 26.0–34.0)
MCHC: 33.3 g/dL (ref 30.0–36.0)
MCV: 88.7 fL (ref 78.0–100.0)
Monocytes Absolute: 1.2 10*3/uL — ABNORMAL HIGH (ref 0.1–1.0)
Monocytes Relative: 10 %
Neutro Abs: 9.7 10*3/uL — ABNORMAL HIGH (ref 1.7–7.7)
Neutrophils Relative %: 82 %
PLATELETS: 182 10*3/uL (ref 150–400)
RBC: 4.67 MIL/uL (ref 3.87–5.11)
RDW: 13.3 % (ref 11.5–15.5)
WBC: 11.9 10*3/uL — AB (ref 4.0–10.5)

## 2017-06-20 LAB — COMPREHENSIVE METABOLIC PANEL
ALBUMIN: 3.3 g/dL — AB (ref 3.5–5.0)
ALT: 15 U/L (ref 14–54)
AST: 24 U/L (ref 15–41)
Alkaline Phosphatase: 74 U/L (ref 38–126)
Anion gap: 10 (ref 5–15)
BUN: 14 mg/dL (ref 6–20)
CHLORIDE: 102 mmol/L (ref 101–111)
CO2: 24 mmol/L (ref 22–32)
Calcium: 9 mg/dL (ref 8.9–10.3)
Creatinine, Ser: 0.59 mg/dL (ref 0.44–1.00)
GFR calc Af Amer: >60 mL/min (ref >60)
GLUCOSE: 180 mg/dL — AB (ref 65–99)
Potassium: 3.5 mmol/L (ref 3.5–5.1)
Sodium: 136 mmol/L (ref 135–145)
Total Bilirubin: 0.6 mg/dL (ref 0.3–1.2)
Total Protein: 7 g/dL (ref 6.5–8.1)

## 2017-06-20 LAB — TROPONIN I

## 2017-06-20 LAB — AMMONIA: AMMONIA: 12 umol/L (ref 9–35)

## 2017-06-20 LAB — CBG MONITORING, ED: GLUCOSE-CAPILLARY: 172 mg/dL — AB (ref 65–99)

## 2017-06-20 MED ORDER — ACETAMINOPHEN 325 MG PO TABS
650.0000 mg | ORAL_TABLET | Freq: Once | ORAL | Status: AC
  Administered 2017-06-20: 650 mg via ORAL
  Filled 2017-06-20: qty 2

## 2017-06-20 NOTE — ED Provider Notes (Signed)
[facility] EMERGENCY DEPARTMENT Provider Note   CSN: [id number] Arrival date & time: 06/20/17  1948  LEVEL 5 CAVEAT - DEMENTIA   History   Chief Complaint Chief Complaint  Patient presents with  . Altered Mental Status    HPI Anita Powell is a 84 y.o. female.  HPI  84 year old female with a history of dementia, hypertension, hypothyroidism presents with lethargy.  She seems to be sleeping more today.  This is all taken from the facility.  They state that she is not altered but that she has been less awake than normal starting this morning.  Otherwise the history is quite limited as the patient has no acute complaints.  Past Medical History:  Diagnosis Date  . Breast cancer ([place]) 2001   radiation  . Dementia   . Hypertension   . Hypothyroidism     Patient Active Problem List   Diagnosis Date Noted  . Closed left hip fracture ([place]) 10/28/2015  . Closed right hip fracture ([place]) 04/07/2015    Past Surgical History:  Procedure Laterality Date  . BREAST EXCISIONAL BIOPSY Left 2001   positive  . HIP ARTHROPLASTY Right 04/08/2015   Procedure: ARTHROPLASTY BIPOLAR HIP (HEMIARTHROPLASTY);  Surgeon: Dereck Leep, MD;  Location: [facility] ORS;  Service: Orthopedics;  Laterality: Right;  . JOINT REPLACEMENT    . Left hip hemiarthroplasty  2013     OB History   None      Home Medications    Prior to Admission medications   Medication Sig Start Date End Date Taking? Authorizing Provider  acetaminophen (TYLENOL) 325 MG tablet Take 2 tablets (650 mg total) by mouth every 6 (six) hours as needed for mild pain (or Fever >/= 101). 04/11/15  Yes Loletha Grayer, MD  alum & mag hydroxide-simeth ([place]) 200-200-20 MG/5ML suspension Take 30 mLs by mouth every 6 (six) hours as needed for indigestion or heartburn.   Yes [provider]  amLODipine (NORVASC) 2.5 MG tablet Take 2.5 mg by mouth daily.   Yes [provider]  aspirin EC 81 MG tablet Take 81 mg by mouth  daily.    Yes [provider]  cholecalciferol (VITAMIN D) 1000 units tablet Take 2,000 Units by mouth at bedtime.   Yes [provider]  citalopram (CELEXA) 10 MG tablet Take 10 mg by mouth daily.   Yes [provider]  guaifenesin (ROBAFEN) 100 MG/5ML syrup Take 200 mg by mouth every 6 (six) hours as needed for cough.   Yes [provider]  levothyroxine (SYNTHROID, LEVOTHROID) 137 MCG tablet Take 137 mcg by mouth daily before breakfast.   Yes [provider]  loperamide (IMODIUM) 2 MG capsule Take 2 mg by mouth as needed for diarrhea or loose stools.   Yes [provider]  magnesium hydroxide (MILK OF MAGNESIA) 400 MG/5ML suspension Take 30 mLs by mouth at bedtime as needed for mild constipation.   Yes [provider]  metoprolol succinate (TOPROL-XL) 50 MG 24 hr tablet Take 50 mg by mouth daily. Take with or immediately following a meal.   Yes [provider]  omeprazole (PRILOSEC) 20 MG capsule Take 20 mg by mouth daily.   Yes [provider]    Family History Family History  Problem Relation Age of Onset  . Breast cancer Sister 54  . Breast cancer Paternal Aunt 39    Social History Social History   Tobacco Use  . Smoking status: Never Smoker  . Smokeless tobacco: Never  Used  Substance Use Topics  . Alcohol use: Yes    Comment: 3 oz scotch a day   . Drug use: Not on file     Allergies   Patient has no known allergies.   Review of Systems Review of Systems  Unable to perform ROS: Dementia     Physical Exam Updated Vital Signs BP (!) 117/56   Pulse (!) 56   Temp (!) 100.7 F (38.2 C) (Rectal)   Resp 20   Ht 5\' 3"  (1.6 m)   Wt 62.8 kg (138 lb 8 oz)   SpO2 97%   BMI 24.53 kg/m   Physical Exam  Constitutional: She appears well-developed.  Frail, kyphotic  HENT:  Head: Normocephalic and atraumatic.  Right Ear: External ear normal.  Left Ear: External ear normal.  Nose: Nose  normal.  Eyes: Right eye exhibits no discharge. Left eye exhibits no discharge.  Neck: Neck supple. No neck rigidity.  Cardiovascular: Normal rate, regular rhythm and normal heart sounds.  Pulmonary/Chest: Effort normal and breath sounds normal.  Abdominal: Soft. There is no tenderness.  Neurological: She is alert.  Skin: Skin is warm and dry.  Nursing note and vitals reviewed.    ED Treatments / Results  Labs (all labs ordered are listed, but only abnormal results are displayed) Labs Reviewed  COMPREHENSIVE METABOLIC PANEL - Abnormal; Notable for the following components:      Result Value   Glucose, Bld 180 (*)    Albumin 3.3 (*)    All other components within normal limits  CBC WITH DIFFERENTIAL/PLATELET - Abnormal; Notable for the following components:   WBC 11.9 (*)    Neutro Abs 9.7 (*)    Monocytes Absolute 1.2 (*)    All other components within normal limits  URINALYSIS, COMPLETE (UACMP) WITH MICROSCOPIC - Abnormal; Notable for the following components:   Glucose, UA >=500 (*)    All other components within normal limits  CBG MONITORING, ED - Abnormal; Notable for the following components:   Glucose-Capillary 172 (*)    All other components within normal limits  CULTURE, BLOOD (ROUTINE X 2)  CULTURE, BLOOD (ROUTINE X 2)  AMMONIA  TROPONIN I  INFLUENZA PANEL BY PCR (TYPE A & B)    EKG EKG Interpretation  Date/Time:  Friday June 20 2017 21:08:53 EDT Ventricular Rate:  66 PR Interval:    QRS Duration: 95 QT Interval:  421 QTC Calculation: 442 R Axis:   -44 Text Interpretation:  Sinus rhythm Left anterior fascicular block Low voltage, precordial leads Anteroseptal infarct, age indeterminate no significant change since Mar 2018 Confirmed by Sherwood Gambler [phone]) on 06/20/2017 9:27:29 PM   Radiology Dg Chest 2 View  Result Date: 06/20/2017 CLINICAL DATA:  Increasing weakness EXAM: CHEST - 2 VIEW COMPARISON:  04/07/2015 FINDINGS: Cardiac shadow is mildly  enlarged but accentuated by the portable technique. Lungs are well aerated bilaterally. No focal infiltrate or sizable effusion is seen. Chronic appearing compression deformity is noted at the thoracolumbar junction. IMPRESSION: No acute abnormality noted. Electronically Signed   By: Inez Catalina M.D.   On: 06/20/2017 21:14   Ct Head Wo Contrast  Result Date: 06/20/2017 CLINICAL DATA:  Altered level of consciousness EXAM: CT HEAD WITHOUT CONTRAST TECHNIQUE: Contiguous axial images were obtained from the base of the skull through the vertex without intravenous contrast. COMPARISON:  05/05/2016 FINDINGS: Brain: Chronic atrophic and ischemic changes are noted similar to that seen on the prior exam. No findings to suggest acute  hemorrhage, acute infarction or space-occupying mass lesion are noted. Vascular: No hyperdense vessel or unexpected calcification. Skull: Normal. Negative for fracture or focal lesion. Sinuses/Orbits: No acute finding. Other: None. IMPRESSION: Chronic atrophic and ischemic changes.  No acute abnormality noted. Electronically Signed   By: Inez Catalina M.D.   On: 06/20/2017 21:15    Procedures Procedures (including critical care time)  Medications Ordered in ED Medications  acetaminophen (TYLENOL) tablet 650 mg (650 mg Oral Given 06/20/17 2305)     Initial Impression / Assessment and Plan / ED Course  I have reviewed the triage vital signs and the nursing notes.  Pertinent labs & imaging results that were available during my care of the patient were reviewed by me and considered in my medical decision making (see chart for details).     Patient has a mild leukocytosis.  Otherwise the only significant finding is a low-grade temperature of 100.7.  She has no obvious signs of bacterial illness.  Chest x-ray and urinalysis negative.  She is awake and alert and her mental status is at baseline but she has just been sleepier which is likely from the fever.  Given no clear source, I  had a discussion with her daughter over the phone.  We discussed observation at her facility versus observation in the hospital.  She prefers going home as they know her there and can recheck her often.  Think this is reasonable, especially to help avoid a nosocomial infection.  When asking, she does endorse sore throat, although the history is obviously somewhat limited given her dementia.  However she has no acute complaints otherwise and repeat abdominal exams are benign.  Highly doubt meningitis at this time but I have discussed with the daughter that she needs to return if any symptoms were to worsen.  She will be given Tylenol and a flu swab will be sent.  Final Clinical Impressions(s) / ED Diagnoses   Final diagnoses:  Fever in adult    ED Discharge Orders    None       Sherwood Gambler, MD 06/20/17 2349

## 2017-06-20 NOTE — Discharge Instructions (Signed)
You were found to have a low-grade fever today.  If the fever becomes worse or any other new or concerning symptoms develop such as confusion, vomiting, pain anywhere, return to the ER for evaluation.  Otherwise follow-up closely with your primary care doctor.

## 2017-06-20 NOTE — ED Triage Notes (Signed)
Pt arrives via [facility] from [facility]. EMS states pt was sent per nursing home because "she was not acting like herself." Pt denies pain. Per nursing home pt has had increased weakness and lethargy that started "during 1st shift this morning."

## 2017-06-21 LAB — INFLUENZA PANEL BY PCR (TYPE A & B)
INFLAPCR: NEGATIVE
Influenza B By PCR: NEGATIVE

## 2017-06-24 ENCOUNTER — Encounter (HOSPITAL_COMMUNITY): Payer: Self-pay | Admitting: Emergency Medicine

## 2017-06-24 ENCOUNTER — Emergency Department (HOSPITAL_COMMUNITY)
Admission: EM | Admit: 2017-06-24 | Discharge: 2017-06-25 | Disposition: A | Discharge status: Home / Self Care | Payer: Medicare Other | Attending: Emergency Medicine | Admitting: Emergency Medicine

## 2017-06-24 ENCOUNTER — Other Ambulatory Visit: Payer: Self-pay

## 2017-06-24 DIAGNOSIS — Z7982 Long term (current) use of aspirin: Secondary | ICD-10-CM | POA: Diagnosis not present

## 2017-06-24 DIAGNOSIS — Z96643 Presence of artificial hip joint, bilateral: Secondary | ICD-10-CM | POA: Insufficient documentation

## 2017-06-24 DIAGNOSIS — I10 Essential (primary) hypertension: Secondary | ICD-10-CM | POA: Insufficient documentation

## 2017-06-24 DIAGNOSIS — M549 Dorsalgia, unspecified: Secondary | ICD-10-CM | POA: Insufficient documentation

## 2017-06-24 DIAGNOSIS — F0390 Unspecified dementia without behavioral disturbance: Secondary | ICD-10-CM | POA: Insufficient documentation

## 2017-06-24 DIAGNOSIS — Z853 Personal history of malignant neoplasm of breast: Secondary | ICD-10-CM | POA: Insufficient documentation

## 2017-06-24 DIAGNOSIS — Z79899 Other long term (current) drug therapy: Secondary | ICD-10-CM | POA: Insufficient documentation

## 2017-06-24 DIAGNOSIS — E039 Hypothyroidism, unspecified: Secondary | ICD-10-CM | POA: Diagnosis not present

## 2017-06-24 NOTE — ED Triage Notes (Signed)
Pt c/o mid back pain x 2 days. Pt is from [place]. Pt states she fell, but staff denies any falls.

## 2017-06-25 ENCOUNTER — Emergency Department (HOSPITAL_COMMUNITY): Payer: Medicare Other

## 2017-06-25 LAB — CULTURE, BLOOD (ROUTINE X 2)
CULTURE: NO GROWTH
CULTURE: NO GROWTH
SPECIAL REQUESTS: ADEQUATE
Special Requests: ADEQUATE

## 2017-06-25 NOTE — ED Provider Notes (Signed)
[facility] EMERGENCY DEPARTMENT Provider Note   CSN: [id number] Arrival date & time: 06/24/17  2330  Time seen 01:07 AM   History   Chief Complaint Chief Complaint  Patient presents with  . Back Pain   LEVEL 5 CAVEAT FOR DEMENTIA  HPI Anita Powell is a 84 y.o. female.  HPI patient was brought from her nursing facility by EMS for complaints of back pain.  They report patient complained of a fall but the staff denies any falling.  When I asked the patient if her back is hurting she states no.    Past Medical History:  Diagnosis Date  . Breast cancer ([place]) 2001   radiation  . Dementia   . Hypertension   . Hypothyroidism     Patient Active Problem List   Diagnosis Date Noted  . Closed left hip fracture ([place]) 10/28/2015  . Closed right hip fracture ([place]) 04/07/2015    Past Surgical History:  Procedure Laterality Date  . BREAST EXCISIONAL BIOPSY Left 2001   positive  . HIP ARTHROPLASTY Right 04/08/2015   Procedure: ARTHROPLASTY BIPOLAR HIP (HEMIARTHROPLASTY);  Surgeon: Dereck Leep, MD;  Location: [facility] ORS;  Service: Orthopedics;  Laterality: Right;  . JOINT REPLACEMENT    . Left hip hemiarthroplasty  2013     OB History   None      Home Medications    Prior to Admission medications   Medication Sig Start Date End Date Taking? Authorizing Provider  acetaminophen (TYLENOL) 325 MG tablet Take 2 tablets (650 mg total) by mouth every 6 (six) hours as needed for mild pain (or Fever >/= 101). 04/11/15   Loletha Grayer, MD  alum & mag hydroxide-simeth ([place]) 200-200-20 MG/5ML suspension Take 30 mLs by mouth every 6 (six) hours as needed for indigestion or heartburn.    [provider]  amLODipine (NORVASC) 2.5 MG tablet Take 2.5 mg by mouth daily.    [provider]  aspirin EC 81 MG tablet Take 81 mg by mouth daily.     [provider]  cholecalciferol (VITAMIN D) 1000 units tablet Take 2,000 Units by mouth at bedtime.    [provider]  citalopram (CELEXA) 10 MG tablet Take 10 mg by mouth daily.    [provider]  guaifenesin (ROBAFEN) 100 MG/5ML syrup Take 200 mg by mouth every 6 (six) hours as needed for cough.    [provider]  levothyroxine (SYNTHROID, LEVOTHROID) 137 MCG tablet Take 137 mcg by mouth daily before breakfast.    [provider]  loperamide (IMODIUM) 2 MG capsule Take 2 mg by mouth as needed for diarrhea or loose stools.    [provider]  magnesium hydroxide (MILK OF MAGNESIA) 400 MG/5ML suspension Take 30 mLs by mouth at bedtime as needed for mild constipation.    [provider]  metoprolol succinate (TOPROL-XL) 50 MG 24 hr tablet Take 50 mg by mouth daily. Take with or immediately following a meal.    [provider]  omeprazole (PRILOSEC) 20 MG capsule Take 20 mg by mouth daily.    [provider]    Family History Family History  Problem Relation Age of Onset  . Breast cancer Sister 5  . Breast cancer Paternal Aunt 55    Social History Social History   Tobacco Use  . Smoking status: Never Smoker  . Smokeless tobacco: Never Used  Substance Use Topics  . Alcohol use: Yes    Comment: 3 oz  scotch a day   . Drug use: Not on file  lives in a NH   Allergies   Patient has no known allergies.   Review of Systems Review of Systems  Unable to perform ROS: Dementia     Physical Exam Updated Vital Signs BP (!) 109/59   Pulse 71   Temp 98.4 F (36.9 C)   Resp 17   Ht 5\' 3"  (1.6 m)   Wt 62.6 kg (138 lb)   SpO2 94%   BMI 24.45 kg/m   Vital signs normal    Physical Exam  Constitutional: No distress.  Frail elderly female sleeping on her left side.  She is easily awakened.  She is cooperative.  HENT:  Head: Normocephalic and atraumatic.  Right Ear: External ear normal.  Left Ear: External ear normal.  Nose: Nose normal.  When I asked the patient to open her mouth she states okay however she does  not seem to understand how to open her mouth.  Eyes: Pupils are equal, round, and reactive to light. Conjunctivae and EOM are normal.  Neck:  Nontender to palpation of the cervical spine  Cardiovascular: Normal rate, regular rhythm and normal heart sounds.  Pulmonary/Chest: Effort normal and breath sounds normal. No respiratory distress. She exhibits no tenderness.  Ribs are nontender to stressing  Abdominal: Soft. Bowel sounds are normal. There is no tenderness.  Musculoskeletal: Normal range of motion. She exhibits no tenderness.  When I move the patient's legs she does not have any pain.  She has no pain in her upper arms with range of motion.  When I palpate her thoracic and lumbar spine she denies any pain.  There is no obvious bruising seen.  Neurological: She is alert. No cranial nerve deficit.  Skin: Skin is warm and dry.  Psychiatric: She has a normal mood and affect.  Nursing note and vitals reviewed.    ED Treatments / Results  Labs (all labs ordered are listed, but only abnormal results are displayed) Labs Reviewed - No data to display  EKG None  Radiology Dg Thoracic Spine 2 View  Result Date: 06/25/2017 CLINICAL DATA:  Mid back pain for 2 days after a fall. EXAM: THORACIC SPINE 2 VIEWS COMPARISON:  Two-view chest 06/20/2017 FINDINGS: Diffuse bone demineralization. Normal alignment of the thoracic spine. Anterior wedge deformity of a midthoracic vertebra, likely T8, and lower thoracic/lumbar vertebrae, likely T12, L1, and L2. These changes appear a similar to previous chest radiograph. No evidence of acute vertebral compression deformity. Degenerative changes throughout the thoracic spine with narrowed interspaces and endplate hypertrophic changes. No focal bone lesion or bone destruction. No paraspinal soft tissue swelling. IMPRESSION: No acute bony abnormalities identified in the thoracic spine. Old anterior wedge deformities at T8, T12, L1, and L2. Degenerative changes  throughout. Electronically Signed   By: Lucienne Capers M.D.   On: 06/25/2017 00:55   Dg Lumbar Spine Complete  Result Date: 06/25/2017 CLINICAL DATA:  Mid back pain for 2 days after a fall. EXAM: LUMBAR SPINE - COMPLETE 4+ VIEW COMPARISON:  Two-view chest 06/20/2017 FINDINGS: Normal alignment of the lumbar spine. There are anterior compression fractures at T12, L1, and L2. The T12 and L1 fractures are also included on the previous chest radiograph without change, suggesting these are likely old. No definite acute fracture identified. Degenerative changes throughout the lumbar spine with narrowed interspaces and endplate hypertrophic changes. Degenerative disc disease at all levels. No destructive or expansile lesions. Old appearing deformity of the  distal sacral spine. Visualize sacral struts appear intact. Vascular calcifications. Calcification in the pelvis likely representing a calcified fibroid. Bilateral hip arthroplasties. IMPRESSION: Old appearing anterior compression deformities at T12, L1, and L2. Diffuse degenerative changes. No acute displaced fractures identified. Aortic atherosclerosis. Electronically Signed   By: Lucienne Capers M.D.   On: 06/25/2017 01:02    Procedures Procedures (including critical care time)  Medications Ordered in ED Medications - No data to display   Initial Impression / Assessment and Plan / ED Course  I have reviewed the triage vital signs and the nursing notes.  Pertinent labs & imaging results that were available during my care of the patient were reviewed by me and considered in my medical decision making (see chart for details).     X-rays were obtained of her thoracic lumbar spine due to her complaining at her nursing facility, patient denies pain to me however during her ED visit.  Patient's x-rays do not show any new acute findings.  She was sent back to her nursing facility.  Final Clinical Impressions(s) / ED Diagnoses   Final diagnoses:    Back pain, unspecified back location, unspecified back pain laterality, unspecified chronicity    ED Discharge Orders    None      Plan discharge  Rolland Porter, MD, Barbette Or, MD 06/25/17 [phone]

## 2017-06-25 NOTE — Discharge Instructions (Addendum)
We did xrays of her thoracic (upper spine) and lumbar (lower spine) vertebra and she has no new fractures. She denied having pain to me. Recheck for any new problems.

## 2017-06-25 NOTE — ED Notes (Signed)
Pt unable to tell this nurse what month or year it currently is. Pt oriented to self only.

## 2017-06-25 NOTE — ED Notes (Signed)
Pt unable to sign discharge papers 

## 2017-07-12 ENCOUNTER — Emergency Department (HOSPITAL_COMMUNITY): Payer: Medicare Other

## 2017-07-12 ENCOUNTER — Emergency Department (HOSPITAL_COMMUNITY)
Admission: EM | Admit: 2017-07-12 | Discharge: 2017-07-12 | Disposition: A | Discharge status: Home / Self Care | Payer: Medicare Other | Attending: Emergency Medicine | Admitting: Emergency Medicine

## 2017-07-12 ENCOUNTER — Encounter (HOSPITAL_COMMUNITY): Payer: Self-pay

## 2017-07-12 ENCOUNTER — Other Ambulatory Visit: Payer: Self-pay

## 2017-07-12 DIAGNOSIS — Z79899 Other long term (current) drug therapy: Secondary | ICD-10-CM | POA: Insufficient documentation

## 2017-07-12 DIAGNOSIS — Z7982 Long term (current) use of aspirin: Secondary | ICD-10-CM | POA: Diagnosis not present

## 2017-07-12 DIAGNOSIS — F0390 Unspecified dementia without behavioral disturbance: Secondary | ICD-10-CM | POA: Diagnosis not present

## 2017-07-12 DIAGNOSIS — Y92122 Bedroom in nursing home as the place of occurrence of the external cause: Secondary | ICD-10-CM | POA: Insufficient documentation

## 2017-07-12 DIAGNOSIS — Y998 Other external cause status: Secondary | ICD-10-CM | POA: Diagnosis not present

## 2017-07-12 DIAGNOSIS — S0990XA Unspecified injury of head, initial encounter: Secondary | ICD-10-CM | POA: Diagnosis present

## 2017-07-12 DIAGNOSIS — S0083XA Contusion of other part of head, initial encounter: Secondary | ICD-10-CM | POA: Diagnosis not present

## 2017-07-12 DIAGNOSIS — Z96643 Presence of artificial hip joint, bilateral: Secondary | ICD-10-CM | POA: Diagnosis not present

## 2017-07-12 DIAGNOSIS — E039 Hypothyroidism, unspecified: Secondary | ICD-10-CM | POA: Diagnosis not present

## 2017-07-12 DIAGNOSIS — W0110XA Fall on same level from slipping, tripping and stumbling with subsequent striking against unspecified object, initial encounter: Secondary | ICD-10-CM | POA: Insufficient documentation

## 2017-07-12 DIAGNOSIS — Z853 Personal history of malignant neoplasm of breast: Secondary | ICD-10-CM | POA: Insufficient documentation

## 2017-07-12 DIAGNOSIS — I10 Essential (primary) hypertension: Secondary | ICD-10-CM | POA: Diagnosis not present

## 2017-07-12 DIAGNOSIS — Y9389 Activity, other specified: Secondary | ICD-10-CM | POA: Insufficient documentation

## 2017-07-12 DIAGNOSIS — W19XXXA Unspecified fall, initial encounter: Secondary | ICD-10-CM

## 2017-07-12 DIAGNOSIS — N390 Urinary tract infection, site not specified: Secondary | ICD-10-CM

## 2017-07-12 HISTORY — DX: Gastro-esophageal reflux disease without esophagitis: K21.9

## 2017-07-12 HISTORY — DX: Muscle weakness (generalized): M62.81

## 2017-07-12 LAB — URINALYSIS, ROUTINE W REFLEX MICROSCOPIC
BACTERIA UA: NONE SEEN
Bilirubin Urine: NEGATIVE
GLUCOSE, UA: NEGATIVE mg/dL
HGB URINE DIPSTICK: NEGATIVE
Ketones, ur: NEGATIVE mg/dL
NITRITE: NEGATIVE
PH: 6 (ref 5.0–8.0)
PROTEIN: NEGATIVE mg/dL
SPECIFIC GRAVITY, URINE: 1.009 (ref 1.005–1.030)

## 2017-07-12 LAB — CBG MONITORING, ED: Glucose-Capillary: 91 mg/dL (ref 65–99)

## 2017-07-12 MED ORDER — FLUCONAZOLE 100 MG PO TABS
200.0000 mg | ORAL_TABLET | Freq: Once | ORAL | Status: AC
  Administered 2017-07-12: 200 mg via ORAL
  Filled 2017-07-12: qty 2

## 2017-07-12 MED ORDER — CEPHALEXIN 500 MG PO CAPS
500.0000 mg | ORAL_CAPSULE | Freq: Once | ORAL | Status: AC
  Administered 2017-07-12: 500 mg via ORAL
  Filled 2017-07-12: qty 1

## 2017-07-12 MED ORDER — CEPHALEXIN 500 MG PO CAPS: 500.0000 mg | ORAL_CAPSULE | Freq: Four times a day (QID) | ORAL | 0 refills | Status: DC

## 2017-07-12 NOTE — ED Notes (Signed)
Reviewed d/c instructions with ems.  NO answer at facility.

## 2017-07-12 NOTE — ED Provider Notes (Signed)
[facility] EMERGENCY DEPARTMENT Provider Note   CSN: [id number] Arrival date & time: 07/12/17  3810     History   Chief Complaint Chief Complaint  Patient presents with  . Fall    HPI Anita Powell is a 84 y.o. female.  Level 5 caveat  Patient is an 84 year old female from [facility] facility.  The history is obtained from the staff at the [facility], as well as from EMS.  The nursing staff states that this morning they checked on her early during the rounds.  When they came back a few minutes later she was on the floor.  The staff at the [facility] state that the patient states that she was parachuting.  They found her on the floor on her back.  Patient has chronic back and leg pain, and complained of back and leg pain when they moved her back to her bed.  The nursing staff report that the patient has a hematoma of the right forehead area.  Reporting that this was not noted on last evening.  It is of note that the patient has dementia, and no history was able to be obtained from the patient.  Review of the medication list suggest that the patient is not on any anticoagulation medications.  Patient has a history of right and left hip fractures and advanced degenerative changes of the lumbar area.  Patient presents now for assistance with these issues.  The history is provided by the EMS personnel and the nursing home.    Past Medical History:  Diagnosis Date  . Breast cancer ([place]) 2001   radiation  . Dementia   . GERD (gastroesophageal reflux disease)   . Hypertension   . Hypothyroidism   . Muscle weakness     Patient Active Problem List   Diagnosis Date Noted  . Closed left hip fracture ([place]) 10/28/2015  . Closed right hip fracture ([place]) 04/07/2015    Past Surgical History:  Procedure Laterality Date  . BREAST EXCISIONAL BIOPSY Left 2001   positive  . HIP ARTHROPLASTY Right 04/08/2015   Procedure: ARTHROPLASTY BIPOLAR HIP (HEMIARTHROPLASTY);   Surgeon: Dereck Leep, MD;  Location: [facility] ORS;  Service: Orthopedics;  Laterality: Right;  . JOINT REPLACEMENT    . Left hip hemiarthroplasty  2013     OB History   None      Home Medications    Prior to Admission medications   Medication Sig Start Date End Date Taking? Authorizing Provider  acetaminophen (TYLENOL) 325 MG tablet Take 2 tablets (650 mg total) by mouth every 6 (six) hours as needed for mild pain (or Fever >/= 101). 04/11/15   Loletha Grayer, MD  alum & mag hydroxide-simeth ([place]) 200-200-20 MG/5ML suspension Take 30 mLs by mouth every 6 (six) hours as needed for indigestion or heartburn.    [provider]  amLODipine (NORVASC) 2.5 MG tablet Take 2.5 mg by mouth daily.    [provider]  aspirin EC 81 MG tablet Take 81 mg by mouth daily.     [provider]  cholecalciferol (VITAMIN D) 1000 units tablet Take 2,000 Units by mouth at bedtime.    [provider]  citalopram (CELEXA) 10 MG tablet Take 10 mg by mouth daily.    [provider]  guaifenesin (ROBAFEN) 100 MG/5ML syrup Take 200 mg by mouth every 6 (six) hours as needed for cough.    [provider]  levothyroxine (SYNTHROID, LEVOTHROID) 137 MCG tablet Take  137 mcg by mouth daily before breakfast.    [provider]  loperamide (IMODIUM) 2 MG capsule Take 2 mg by mouth as needed for diarrhea or loose stools.    [provider]  magnesium hydroxide (MILK OF MAGNESIA) 400 MG/5ML suspension Take 30 mLs by mouth at bedtime as needed for mild constipation.    [provider]  metoprolol succinate (TOPROL-XL) 50 MG 24 hr tablet Take 50 mg by mouth daily. Take with or immediately following a meal.    [provider]  omeprazole (PRILOSEC) 20 MG capsule Take 20 mg by mouth daily.    [provider]    Family History Family History  Problem Relation Age of Onset  . Breast cancer Sister 79  . Breast cancer Paternal  Aunt 50    Social History Social History   Tobacco Use  . Smoking status: Never Smoker  . Smokeless tobacco: Never Used  Substance Use Topics  . Alcohol use: Yes    Comment: 3 oz scotch a day   . Drug use: Not on file     Allergies   Patient has no known allergies.   Review of Systems Review of Systems  Unable to perform ROS: Dementia  Constitutional: Negative for activity change.       All ROS Neg except as noted in HPI  HENT: Negative for nosebleeds.   Eyes: Negative for photophobia and discharge.  Respiratory: Negative for cough, shortness of breath and wheezing.   Cardiovascular: Negative for chest pain and palpitations.  Gastrointestinal: Negative for abdominal pain and blood in stool.  Genitourinary: Negative for dysuria, frequency and hematuria.  Musculoskeletal: Positive for arthralgias, back pain and gait problem. Negative for neck pain.  Skin: Negative.   Neurological: Negative for dizziness, seizures and speech difficulty.  Psychiatric/Behavioral: Negative for confusion and hallucinations.       Dementia     Physical Exam Updated Vital Signs BP 138/84   Pulse 62   Temp (!) 97.5 F (36.4 C) (Oral)   Resp 18   Wt 62.6 kg (138 lb)   SpO2 98%   BMI 24.45 kg/m   Physical Exam  Constitutional: She appears well-developed and well-nourished.  Non-toxic appearance.  HENT:  Right Ear: Tympanic membrane and external ear normal.  Left Ear: Tympanic membrane and external ear normal.   bruise to the right forehead. No other facial or scalp hematoma.  Eyes: Pupils are equal, round, and reactive to light. Conjunctivae and lids are normal.  Neck: Normal range of motion. Neck supple. Carotid bruit is not present. No tracheal deviation present.  Cardiovascular: Normal heart sounds and normal pulses. Exam reveals no gallop and no friction rub.  Pt alternates from a bradycardia of 42 to NSR of 87. No rub or gallop.  Pulmonary/Chest: Breath sounds normal. No  respiratory distress. She has no wheezes. She has no rales. She exhibits no tenderness.  Abdominal: Soft. Bowel sounds are normal. She exhibits no distension. There is no tenderness. There is no guarding.  Musculoskeletal:  There is no deformity of the upper extremities.  The radial pulses are 2+.  Capillary refill is 2 seconds.  There are degenerative changes of the upper extremities.  There is no pain with moving or rocking of the pelvis.  There is no deformity of the lower extremity appreciated.  The capillary refill is 2 seconds.  I cannot demonstrate pain with movement or palpation of the lower extremity.  There is no pain with movement of  rocking of the pelvis area.  Lymphadenopathy:       Head (right side): No submandibular adenopathy present.       Head (left side): No submandibular adenopathy present.    She has no cervical adenopathy.  Neurological: She has normal strength. No cranial nerve deficit or sensory deficit.  Skin: Skin is warm and dry.  Psychiatric: Her speech is normal.  Unable to test due to dementia  Nursing note and vitals reviewed.    ED Treatments / Results  Labs (all labs ordered are listed, but only abnormal results are displayed) Labs Reviewed - No data to display  EKG None  Radiology No results found.  Procedures Procedures (including critical care time)  Medications Ordered in ED Medications - No data to display   Initial Impression / Assessment and Plan / ED Course  I have reviewed the triage vital signs and the nursing notes.  Pertinent labs & imaging results that were available during my care of the patient were reviewed by me and considered in my medical decision making (see chart for details).     Pt seen with me by Dr Sabra Heck.  Final Clinical Impressions(s) / ED Diagnoses MDM  Vital signs reviewed.  Pulse oximetry is 98% on room air.  Patient easily arousable.  She will acknowledge my presence in the room, but does not really answer  a lot of questions.  Nursing staff at [facility] states that that this is pretty much her baseline.  CT scan of the head and neck are negative for acute changes.  Urine suggest a urinary tract infection, the culture has been sent to the lab.  Patient will be placed on Keflex.  X-ray of the pelvis is negative for acute problem.  Feel that it is safe for the patient to be transported back to [facility].  I have asked the nursing staff to contact her primary physician of the fall as well as the urinary tract infection and to have this rechecked.   Final diagnoses:  Fall, initial encounter  Forehead contusion, initial encounter  Urinary tract infection without hematuria, site unspecified    ED Discharge Orders    None       Lily Kocher, PA-C 07/12/17 1129    Noemi Chapel, MD 07/13/17 [phone]

## 2017-07-12 NOTE — ED Triage Notes (Signed)
EMS says pt resident of [facility] and fell out of bed a week ago and hit head.  Staff concerned because pt has been sleeping more than usual.  EMS says staff told them she fell this morning but pt denies falling today.  Pt has older bruised to forehead.

## 2017-07-12 NOTE — ED Provider Notes (Signed)
Medical screening examination/treatment/procedure(s) were conducted as a shared visit with non-physician practitioner(s) and myself.  I personally evaluated the patient during the encounter.  Clinical Impression:   Final diagnoses:  Fall, initial encounter  Forehead contusion, initial encounter  Urinary tract infection without hematuria, site unspecified     The patient is a 84 year old female, she has dementia, she lives in a nursing facility, she was found on the ground beside her bed after having an unwitnessed fall.  There was a report about increasing sleepiness.  She had a hematoma to her head and thus she was sent to the hospital.  On exam the patient does not fact have an old bruising pattern on the right forehead which is yellowing and green in color, it is not fresh purple-blue or tender.  No other findings of acute trauma.  Otherwise patient is calm, stable appearing with regard to vital signs and work-up.  Likely urinary tract infection.  Stable for discharge with antibiotics.   Noemi Chapel, MD 07/13/17 [phone]

## 2017-07-12 NOTE — ED Notes (Signed)
Attempted 3 times to contact [facility] for ride.  Staff there I spoke with said she did not know what to do and could not help me.  Transferred me to different department with no answer.  EMS called.

## 2017-07-12 NOTE — Discharge Instructions (Addendum)
Vital signs within normal limits.  CT scan of the head as well as the neck are negative for new or acute fractures.  There is no intracranial abnormality appreciated.  There are noted significant areas of arthritis and degenerative disc disease in the neck.  X-ray of the pelvis is now also negative for acute problem.  Please use Tylenol extra strength for soreness, and/or headache.  Please have Ms. Anita Powell followed by the primary physician.  Return to the emergency department if any emergent changes in condition, problems, or concerns.

## 2017-07-14 LAB — URINE CULTURE: SPECIAL REQUESTS: NORMAL

## 2017-07-15 ENCOUNTER — Telehealth: Payer: Self-pay | Admitting: Emergency Medicine

## 2017-07-15 NOTE — Telephone Encounter (Signed)
Post ED Visit - Positive Culture Follow-up  Culture report reviewed by antimicrobial stewardship pharmacist:  [x]  Elenor Quinones, Pharm.D. []  Heide Guile, Pharm.D., BCPS AQ-ID []  Parks Neptune, Pharm.D., BCPS []  Alycia Rossetti, Pharm.D., BCPS []  [place], Pharm.D., BCPS, AAHIVP []  Legrand Como, Pharm.D., BCPS, AAHIVP []  Salome Arnt, PharmD, BCPS []  Wynell Balloon, PharmD []  Vincenza Hews, PharmD, BCPS  Positive urine culture Treated with cephalexin, organism sensitive to the same and no further patient follow-up is required at this time.  Hazle Nordmann 07/15/2017, 3:27 PM

## 2017-08-09 ENCOUNTER — Other Ambulatory Visit: Payer: Self-pay

## 2017-08-09 ENCOUNTER — Emergency Department (HOSPITAL_COMMUNITY)
Admission: EM | Admit: 2017-08-09 | Discharge: 2017-08-09 | Disposition: A | Discharge status: Skilled Nursing Facility | Payer: Medicare Other | Attending: Emergency Medicine | Admitting: Emergency Medicine

## 2017-08-09 ENCOUNTER — Encounter (HOSPITAL_COMMUNITY): Payer: Self-pay | Admitting: Emergency Medicine

## 2017-08-09 DIAGNOSIS — Z79899 Other long term (current) drug therapy: Secondary | ICD-10-CM | POA: Diagnosis not present

## 2017-08-09 DIAGNOSIS — R197 Diarrhea, unspecified: Secondary | ICD-10-CM | POA: Insufficient documentation

## 2017-08-09 DIAGNOSIS — Z853 Personal history of malignant neoplasm of breast: Secondary | ICD-10-CM | POA: Insufficient documentation

## 2017-08-09 DIAGNOSIS — E039 Hypothyroidism, unspecified: Secondary | ICD-10-CM | POA: Diagnosis not present

## 2017-08-09 DIAGNOSIS — I10 Essential (primary) hypertension: Secondary | ICD-10-CM | POA: Insufficient documentation

## 2017-08-09 DIAGNOSIS — F0390 Unspecified dementia without behavioral disturbance: Secondary | ICD-10-CM | POA: Diagnosis not present

## 2017-08-09 DIAGNOSIS — R112 Nausea with vomiting, unspecified: Secondary | ICD-10-CM | POA: Diagnosis present

## 2017-08-09 DIAGNOSIS — Z7982 Long term (current) use of aspirin: Secondary | ICD-10-CM | POA: Diagnosis not present

## 2017-08-09 LAB — CBC WITH DIFFERENTIAL/PLATELET
BASOS ABS: 0 10*3/uL (ref 0.0–0.1)
BASOS PCT: 0 %
Eosinophils Absolute: 0.1 10*3/uL (ref 0.0–0.7)
Eosinophils Relative: 1 %
HCT: 44 % (ref 36.0–46.0)
HEMOGLOBIN: 14.1 g/dL (ref 12.0–15.0)
Lymphocytes Relative: 10 %
Lymphs Abs: 0.8 10*3/uL (ref 0.7–4.0)
MCH: 29.2 pg (ref 26.0–34.0)
MCHC: 32 g/dL (ref 30.0–36.0)
MCV: 91.1 fL (ref 78.0–100.0)
Monocytes Absolute: 0.4 10*3/uL (ref 0.1–1.0)
Monocytes Relative: 5 %
NEUTROS ABS: 6.7 10*3/uL (ref 1.7–7.7)
NEUTROS PCT: 84 %
Platelets: 197 10*3/uL (ref 150–400)
RBC: 4.83 MIL/uL (ref 3.87–5.11)
RDW: 14.6 % (ref 11.5–15.5)
WBC: 7.9 10*3/uL (ref 4.0–10.5)

## 2017-08-09 LAB — COMPREHENSIVE METABOLIC PANEL
ALBUMIN: 3.5 g/dL (ref 3.5–5.0)
ALK PHOS: 74 U/L (ref 38–126)
ALT: 15 U/L (ref 14–54)
AST: 19 U/L (ref 15–41)
Anion gap: 9 (ref 5–15)
BUN: 18 mg/dL (ref 6–20)
CALCIUM: 9.3 mg/dL (ref 8.9–10.3)
CO2: 27 mmol/L (ref 22–32)
Chloride: 104 mmol/L (ref 101–111)
Creatinine, Ser: 0.67 mg/dL (ref 0.44–1.00)
GFR calc Af Amer: >60 mL/min (ref >60)
GFR calc non Af Amer: >60 mL/min (ref >60)
GLUCOSE: 177 mg/dL — AB (ref 65–99)
Potassium: 3.6 mmol/L (ref 3.5–5.1)
Sodium: 140 mmol/L (ref 135–145)
TOTAL PROTEIN: 6.8 g/dL (ref 6.5–8.1)
Total Bilirubin: 0.7 mg/dL (ref 0.3–1.2)

## 2017-08-09 LAB — TROPONIN I: Troponin I: <0.03 ng/mL (ref <0.03)

## 2017-08-09 LAB — LIPASE, BLOOD: Lipase: 28 U/L (ref 11–51)

## 2017-08-09 MED ORDER — ONDANSETRON HCL 4 MG/2ML IJ SOLN
4.0000 mg | Freq: Once | INTRAMUSCULAR | Status: AC
  Administered 2017-08-09: 4 mg via INTRAVENOUS
  Filled 2017-08-09: qty 2

## 2017-08-09 MED ORDER — SODIUM CHLORIDE 0.9 % IV SOLN
Freq: Once | INTRAVENOUS | Status: AC
  Administered 2017-08-09: 12:00:00 via INTRAVENOUS

## 2017-08-09 MED ORDER — METOCLOPRAMIDE HCL 10 MG PO TABS: 10.0000 mg | ORAL_TABLET | Freq: Three times a day (TID) | ORAL | 0 refills | Status: AC | PRN

## 2017-08-09 MED ORDER — SODIUM CHLORIDE 0.9 % IV BOLUS
500.0000 mL | Freq: Once | INTRAVENOUS | Status: AC
  Administered 2017-08-09: 500 mL via INTRAVENOUS

## 2017-08-09 NOTE — ED Triage Notes (Addendum)
Pt is a resident of [facility]. Facility stated pt woke up with n/v/d today. Pt visited with a sick family member yesterday with the same symptoms. HR in the 40s.

## 2017-08-09 NOTE — ED Provider Notes (Signed)
[facility] EMERGENCY DEPARTMENT Provider Note   CSN: [id number] Arrival date & time: 08/09/17  1039     History   Chief Complaint Chief Complaint  Patient presents with  . Emesis  . Diarrhea    HPI Anita Powell is a 84 y.o. female.  HPI  The patient is an 84 year old female with a known history of breast cancer dementia hypertension and acid reflux, she currently lives in a nursing facility [facility], reportedly she had been visited by some family members who were ill and today she had recently this morning started to have nausea vomiting and diarrhea herself.  When I asked the patient if there is anything wrong she says that she feels fine without any complaints of pain or any other symptoms.  She does endorse having some nausea but cannot tell me if she is been vomiting or having diarrhea.  She denies headaches abdominal pain coughing or swelling.  Level 5 caveat applied secondary to altered mental status from dementia  Past Medical History:  Diagnosis Date  . Breast cancer ([place]) 2001   radiation  . Dementia   . GERD (gastroesophageal reflux disease)   . Hypertension   . Hypothyroidism   . Muscle weakness     Patient Active Problem List   Diagnosis Date Noted  . Closed left hip fracture ([place]) 10/28/2015  . Closed right hip fracture ([place]) 04/07/2015    Past Surgical History:  Procedure Laterality Date  . BREAST EXCISIONAL BIOPSY Left 2001   positive  . HIP ARTHROPLASTY Right 04/08/2015   Procedure: ARTHROPLASTY BIPOLAR HIP (HEMIARTHROPLASTY);  Surgeon: Dereck Leep, MD;  Location: [facility] ORS;  Service: Orthopedics;  Laterality: Right;  . JOINT REPLACEMENT    . Left hip hemiarthroplasty  2013     OB History   None      Home Medications    Prior to Admission medications   Medication Sig Start Date End Date Taking? Authorizing Provider  acetaminophen (TYLENOL) 325 MG tablet Take 2 tablets (650 mg total) by mouth every 6 (six) hours as needed  for mild pain (or Fever >/= 101). 04/11/15   Loletha Grayer, MD  alum & mag hydroxide-simeth ([place]) 200-200-20 MG/5ML suspension Take 30 mLs by mouth every 6 (six) hours as needed for indigestion or heartburn.    [provider]  amLODipine (NORVASC) 2.5 MG tablet Take 2.5 mg by mouth daily.    [provider]  aspirin EC 81 MG tablet Take 81 mg by mouth daily.     [provider]  cephALEXin (KEFLEX) 500 MG capsule Take 1 capsule (500 mg total) by mouth 4 (four) times daily. 07/12/17   Lily Kocher, PA-C  cholecalciferol (VITAMIN D) 1000 units tablet Take 2,000 Units by mouth at bedtime.    [provider]  citalopram (CELEXA) 10 MG tablet Take 10 mg by mouth daily.    [provider]  guaifenesin (ROBAFEN) 100 MG/5ML syrup Take 200 mg by mouth every 6 (six) hours as needed for cough.    [provider]  levothyroxine (SYNTHROID, LEVOTHROID) 137 MCG tablet Take 137 mcg by mouth daily before breakfast.    [provider]  loperamide (IMODIUM) 2 MG capsule Take 2 mg by mouth as needed for diarrhea or loose stools.    [provider]  magnesium hydroxide (MILK OF MAGNESIA) 400 MG/5ML suspension Take 30 mLs by mouth at bedtime as needed for mild constipation.    [provider]  metoCLOPramide (REGLAN) 10 MG tablet Take 1 tablet (10 mg total) by mouth every 8 (eight) hours as needed for nausea. 08/09/17   Noemi Chapel, MD  metoprolol succinate (TOPROL-XL) 50 MG 24 hr tablet Take 50 mg by mouth daily. Take with or immediately following a meal.    [provider]  omeprazole (PRILOSEC) 20 MG capsule Take 20 mg by mouth daily.    [provider]    Family History Family History  Problem Relation Age of Onset  . Breast cancer Sister 77  . Breast cancer Paternal Aunt 8    Social History Social History   Tobacco Use  . Smoking status: Never Smoker  . Smokeless tobacco: Never Used  Substance Use  Topics  . Alcohol use: Yes    Comment: 3 oz scotch a day   . Drug use: Not on file     Allergies   Patient has no known allergies.   Review of Systems Review of Systems  Unable to perform ROS: Dementia     Physical Exam Updated Vital Signs BP (!) 104/49   Pulse 93   Temp 98.2 F (36.8 C) (Oral)   Resp 16   Wt 62.6 kg (138 lb)   SpO2 97%   BMI 24.45 kg/m   Physical Exam  Constitutional: She appears well-developed and well-nourished. No distress.  HENT:  Head: Normocephalic and atraumatic.  Mouth/Throat: Oropharynx is clear and moist. No oropharyngeal exudate.  Eyes: Pupils are equal, round, and reactive to light. Conjunctivae and EOM are normal. Right eye exhibits no discharge. Left eye exhibits no discharge. No scleral icterus.  Neck: Normal range of motion. Neck supple. No JVD present. No thyromegaly present.  Cardiovascular: Regular rhythm, normal heart sounds and intact distal pulses. Exam reveals no gallop and no friction rub.  No murmur heard. Bradycardia - normal pulses, no murmurs  Pulmonary/Chest: Effort normal and breath sounds normal. No respiratory distress. She has no wheezes. She has no rales.  Abdominal: Soft. Bowel sounds are normal. She exhibits no distension and no mass. There is no tenderness.  The abdomen is soft and nontender, normal bowel sounds  Musculoskeletal: Normal range of motion. She exhibits no edema or tenderness.  Lymphadenopathy:    She has no cervical adenopathy.  Neurological: She is alert. Coordination normal.  She is able to lift all 4 extremities with normal strength, normal grips, she follows commands without difficulty including sitting up in the bed.  There is no obvious facial droop  Skin: Skin is warm and dry. No rash noted. No erythema.  Psychiatric: She has a normal mood and affect. Her behavior is normal.  Nursing note and vitals reviewed.    ED Treatments / Results  Labs (all labs ordered are listed, but only  abnormal results are displayed) Labs Reviewed  COMPREHENSIVE METABOLIC PANEL - Abnormal; Notable for the following components:      Result Value   Glucose, Bld 177 (*)    All other components within normal limits  CBC WITH DIFFERENTIAL/PLATELET  LIPASE, BLOOD  TROPONIN I    EKG EKG Interpretation  Date/Time:  Saturday August 09 2017 11:42:53 EDT Ventricular Rate:  63 PR Interval:    QRS Duration: 97 QT Interval:  490 QTC Calculation: 438 R Axis:   -42 Text Interpretation:  Sinus rhythm Atrial premature complexes Left anterior fascicular block Low voltage, extremity and precordial leads Abnormal R-wave progression, late transition Minimal ST elevation, lateral leads Baseline wander in lead(s) II III aVR aVF  V1 V4 V6 she has had similar EKG findings in past bradycardia is more pronounced today Confirmed by Noemi Chapel [phone]) on 08/09/2017 12:28:45 PM   Radiology No results found.  Procedures Procedures (including critical care time)  Medications Ordered in ED Medications  ondansetron (ZOFRAN) injection 4 mg (has no administration in time range)  sodium chloride 0.9 % bolus 500 mL (0 mLs Intravenous Stopped 08/09/17 1158)  0.9 %  sodium chloride infusion ( Intravenous New Bag/Given 08/09/17 1158)     Initial Impression / Assessment and Plan / ED Course  I have reviewed the triage vital signs and the nursing notes.  Pertinent labs & imaging results that were available during my care of the patient were reviewed by me and considered in my medical decision making (see chart for details).     There is some reported diarrhea and vomiting though the patient has no complaints her exam is unremarkable except for mild bradycardia (on a beta-blocker), she is getting some IV fluids and labs have been ordered by nursing showing no leukocytosis, no anemia, normal renal function, normal troponin.  She will get some more IV fluids and short observation but I suspect she will be able to be  transferred back to her facility with supportive medications.  No evidence of vomiting or diarrhea while in the emergency department, labs are unremarkable, the patient will be given an antinausea medicine for home, stable for discharge  Final Clinical Impressions(s) / ED Diagnoses   Final diagnoses:  Nausea vomiting and diarrhea    ED Discharge Orders        Ordered    metoCLOPramide (REGLAN) 10 MG tablet  Every 8 hours PRN     08/09/17 1413       Noemi Chapel, MD 08/09/17 1414

## 2017-08-09 NOTE — ED Notes (Signed)
Called EMS to transfer Pt back to [facility].

## 2017-08-09 NOTE — Discharge Instructions (Signed)
Your testing has been normal, you likely have a stomach virus causing your vomiting and diarrhea however your blood work has been unremarkable We have given you IV fluids and medications for nausea and you have improved You will likely be contagious to others for the next couple of days Please drink plenty of fluids to stay hydrated   Please obtain all of your results from medical records or have your doctors office obtain the results - share them with your doctor - you should be seen at your doctors office in the next 2 days. Call today to arrange your follow up. Take the medications as prescribed. Please review all of the medicines and only take them if you do not have an allergy to them. Please be aware that if you are taking birth control pills, taking other prescriptions, ESPECIALLY ANTIBIOTICS may make the birth control ineffective - if this is the case, either do not engage in sexual activity or use alternative methods of birth control such as condoms until you have finished the medicine and your family doctor says it is OK to restart them. If you are on a blood thinner such as COUMADIN, be aware that any other medicine that you take may cause the coumadin to either work too much, or not enough - you should have your coumadin level rechecked in next 7 days if this is the case.  ?  It is also a possibility that you have an allergic reaction to any of the medicines that you have been prescribed - Everybody reacts differently to medications and while MOST people have no trouble with most medicines, you may have a reaction such as nausea, vomiting, rash, swelling, shortness of breath. If this is the case, please stop taking the medicine immediately and contact your physician.  ?  You should return to the ER if you develop severe or worsening symptoms.

## 2017-08-09 NOTE — ED Notes (Signed)
RN spoke to [employer] to update on Pt status. RN spoke to staff member Joelene Millin and informed them Pt would be returning via EMS tonight.

## 2017-08-09 NOTE — ED Notes (Signed)
EMS called and notified secretary that it would be after shift change this evening before they could transport pt back to [facility]. Attempted to call [facility] to notify staff, but no answer after multiple attempts.

## 2019-01-27 ENCOUNTER — Encounter (HOSPITAL_COMMUNITY): Payer: Self-pay | Admitting: Emergency Medicine

## 2019-01-27 ENCOUNTER — Other Ambulatory Visit: Payer: Self-pay

## 2019-01-27 ENCOUNTER — Emergency Department (HOSPITAL_COMMUNITY): Payer: Medicare Other

## 2019-01-27 ENCOUNTER — Emergency Department (HOSPITAL_COMMUNITY)
Admission: EM | Admit: 2019-01-27 | Discharge: 2019-01-27 | Disposition: A | Discharge status: Home / Self Care | Payer: Medicare Other | Attending: Emergency Medicine | Admitting: Emergency Medicine

## 2019-01-27 DIAGNOSIS — W050XXA Fall from non-moving wheelchair, initial encounter: Secondary | ICD-10-CM | POA: Insufficient documentation

## 2019-01-27 DIAGNOSIS — S8002XA Contusion of left knee, initial encounter: Secondary | ICD-10-CM | POA: Diagnosis not present

## 2019-01-27 DIAGNOSIS — F0390 Unspecified dementia without behavioral disturbance: Secondary | ICD-10-CM | POA: Diagnosis not present

## 2019-01-27 DIAGNOSIS — Y9210 Unspecified residential institution as the place of occurrence of the external cause: Secondary | ICD-10-CM | POA: Diagnosis not present

## 2019-01-27 DIAGNOSIS — S161XXA Strain of muscle, fascia and tendon at neck level, initial encounter: Secondary | ICD-10-CM | POA: Insufficient documentation

## 2019-01-27 DIAGNOSIS — Z96642 Presence of left artificial hip joint: Secondary | ICD-10-CM | POA: Insufficient documentation

## 2019-01-27 DIAGNOSIS — Y999 Unspecified external cause status: Secondary | ICD-10-CM | POA: Insufficient documentation

## 2019-01-27 DIAGNOSIS — I10 Essential (primary) hypertension: Secondary | ICD-10-CM | POA: Diagnosis not present

## 2019-01-27 DIAGNOSIS — Y939 Activity, unspecified: Secondary | ICD-10-CM | POA: Insufficient documentation

## 2019-01-27 DIAGNOSIS — W19XXXA Unspecified fall, initial encounter: Secondary | ICD-10-CM

## 2019-01-27 DIAGNOSIS — Z853 Personal history of malignant neoplasm of breast: Secondary | ICD-10-CM | POA: Insufficient documentation

## 2019-01-27 DIAGNOSIS — Z79899 Other long term (current) drug therapy: Secondary | ICD-10-CM | POA: Diagnosis not present

## 2019-01-27 DIAGNOSIS — S0083XA Contusion of other part of head, initial encounter: Secondary | ICD-10-CM

## 2019-01-27 DIAGNOSIS — S0990XA Unspecified injury of head, initial encounter: Secondary | ICD-10-CM | POA: Diagnosis present

## 2019-01-27 DIAGNOSIS — E039 Hypothyroidism, unspecified: Secondary | ICD-10-CM | POA: Diagnosis not present

## 2019-01-27 NOTE — ED Provider Notes (Signed)
[facility] EMERGENCY DEPARTMENT Provider Note   CSN: [id number] Arrival date & time: 01/27/19  0830     History   Chief Complaint Chief Complaint  Patient presents with  . Fall    HPI Anita Powell is a 85 y.o. female.     Patient is a 85 year old female with history of dementia, breast cancer, hypertension.  She is brought from her extended care facility for evaluation of fall.  She apparently fell out of her wheelchair this morning and struck her head on the floor.  Patient has a large hematoma to the left forehead.  She denies headache but does complain of some neck discomfort.  Unknown loss of consciousness.  The history is provided by the patient.  Fall This is a new problem. The current episode started less than 1 hour ago. The problem occurs constantly. The problem has not changed since onset.Pertinent negatives include no chest pain, no abdominal pain, no headaches and no shortness of breath. Nothing aggravates the symptoms. Nothing relieves the symptoms. She has tried nothing for the symptoms.    Past Medical History:  Diagnosis Date  . Breast cancer ([place]) 2001   radiation  . Dementia ([place])   . GERD (gastroesophageal reflux disease)   . Hypertension   . Hypothyroidism   . Muscle weakness     Patient Active Problem List   Diagnosis Date Noted  . Closed left hip fracture ([place]) 10/28/2015  . Closed right hip fracture ([place]) 04/07/2015    Past Surgical History:  Procedure Laterality Date  . BREAST EXCISIONAL BIOPSY Left 2001   positive  . HIP ARTHROPLASTY Right 04/08/2015   Procedure: ARTHROPLASTY BIPOLAR HIP (HEMIARTHROPLASTY);  Surgeon: Dereck Leep, MD;  Location: [facility] ORS;  Service: Orthopedics;  Laterality: Right;  . JOINT REPLACEMENT    . Left hip hemiarthroplasty  2013     OB History   No obstetric history on file.      Home Medications    Prior to Admission medications   Medication Sig Start Date End Date Taking? Authorizing Provider   acetaminophen (TYLENOL) 325 MG tablet Take 2 tablets (650 mg total) by mouth every 6 (six) hours as needed for mild pain (or Fever >/= 101). 04/11/15   Loletha Grayer, MD  alum & mag hydroxide-simeth ([place]) 200-200-20 MG/5ML suspension Take 30 mLs by mouth every 6 (six) hours as needed for indigestion or heartburn.    [provider]  amLODipine (NORVASC) 2.5 MG tablet Take 2.5 mg by mouth daily.    [provider]  aspirin EC 81 MG tablet Take 81 mg by mouth daily.     [provider]  cephALEXin (KEFLEX) 500 MG capsule Take 1 capsule (500 mg total) by mouth 4 (four) times daily. 07/12/17   Lily Kocher, PA-C  cholecalciferol (VITAMIN D) 1000 units tablet Take 2,000 Units by mouth at bedtime.    [provider]  citalopram (CELEXA) 10 MG tablet Take 10 mg by mouth daily.    [provider]  guaifenesin (ROBAFEN) 100 MG/5ML syrup Take 200 mg by mouth every 6 (six) hours as needed for cough.    [provider]  levothyroxine (SYNTHROID, LEVOTHROID) 137 MCG tablet Take 137 mcg by mouth daily before breakfast.    [provider]  loperamide (IMODIUM) 2 MG capsule Take 2 mg by mouth as needed for diarrhea or loose stools.    [provider]  magnesium hydroxide (MILK OF MAGNESIA) 400 MG/5ML suspension Take 30 mLs  by mouth at bedtime as needed for mild constipation.    [provider]  metoCLOPramide (REGLAN) 10 MG tablet Take 1 tablet (10 mg total) by mouth every 8 (eight) hours as needed for nausea. 08/09/17   Noemi Chapel, MD  metoprolol succinate (TOPROL-XL) 50 MG 24 hr tablet Take 50 mg by mouth daily. Take with or immediately following a meal.    [provider]  omeprazole (PRILOSEC) 20 MG capsule Take 20 mg by mouth daily.    [provider]    Family History Family History  Problem Relation Age of Onset  . Breast cancer Sister 57  . Breast cancer Paternal Aunt 75    Social History Social  History   Tobacco Use  . Smoking status: Never Smoker  . Smokeless tobacco: Never Used  Substance Use Topics  . Alcohol use: Yes    Comment: 3 oz scotch a day   . Drug use: Not on file     Allergies   Patient has no known allergies.   Review of Systems Review of Systems  Respiratory: Negative for shortness of breath.   Cardiovascular: Negative for chest pain.  Gastrointestinal: Negative for abdominal pain.  Neurological: Negative for headaches.  All other systems reviewed and are negative.    Physical Exam Updated Vital Signs BP 132/62   Pulse (!) 48   Temp (!) 95.4 F (35.2 C) (Rectal)   Resp 20   SpO2 100%   Physical Exam Vitals signs and nursing note reviewed.  Constitutional:      General: She is not in acute distress.    Appearance: She is well-developed. She is not diaphoretic.     Comments: Patient is awake and alert.  She communicates and answers questions appropriately.  HENT:     Head: Normocephalic.     Comments: There is a hematoma to the left forehead with a superficial abrasion/skin tear, but no active bleeding or laceration. Eyes:     Extraocular Movements: Extraocular movements intact.     Pupils: Pupils are equal, round, and reactive to light.  Neck:     Musculoskeletal: Normal range of motion and neck supple.     Comments: There is tenderness in the soft tissues of the cervical region.  There is no bony tenderness or step-off. Cardiovascular:     Rate and Rhythm: Normal rate and regular rhythm.     Heart sounds: No murmur. No friction rub. No gallop.   Pulmonary:     Effort: Pulmonary effort is normal. No respiratory distress.     Breath sounds: Normal breath sounds. No wheezing.  Abdominal:     General: Bowel sounds are normal. There is no distension.     Palpations: Abdomen is soft.     Tenderness: There is no abdominal tenderness.  Musculoskeletal: Normal range of motion.  Skin:    General: Skin is warm and dry.  Neurological:      General: No focal deficit present.     Mental Status: She is alert and oriented to person, place, and time.     Cranial Nerves: No cranial nerve deficit.     Sensory: No sensory deficit.      ED Treatments / Results  Labs (all labs ordered are listed, but only abnormal results are displayed) Labs Reviewed - No data to display  EKG None  Radiology No results found.  Procedures Procedures (including critical care time)  Medications Ordered in ED Medications - No data to display  Initial Impression / Assessment and Plan / ED Course  I have reviewed the triage vital signs and the nursing notes.  Pertinent labs & imaging results that were available during my care of the patient were reviewed by me and considered in my medical decision making (see chart for details).  Patient presenting here after a fall from her wheelchair.  Patient with history of dementia following at her extended care facility.  Patient has a forehead hematoma and abrasion/skin tear, however negative head CT and cervical spine CT.  She also has some bruising to the left knee, however her x-rays are negative.  I see no indication for admission.  Patient seems appropriate for return to her ECF.  She is somewhat bradycardic here in the ER, the etiology of which I am uncertain, however should her blood pressures are normal and she is mentating appropriately.  Patient does have metoprolol on her list of medications and I believe that this medication can be stopped.  This may improve her heart rate.  Final Clinical Impressions(s) / ED Diagnoses   Final diagnoses:  None    ED Discharge Orders    None       Veryl Speak, MD 01/27/19 1028

## 2019-01-27 NOTE — ED Notes (Signed)
Daughter called to get update on Pt.  Contact # [phone]

## 2019-01-27 NOTE — ED Notes (Signed)
Daughter, Lattie Corns [phone]

## 2019-01-27 NOTE — ED Notes (Signed)
Called [facility] for transport back to [facility]

## 2019-01-27 NOTE — ED Triage Notes (Signed)
Pt sent from [facility] she fell out of her wheelchair hitting her head she has a hematoma and laceration to left side of the head. She is sb in the 40's with no hx of this

## 2019-01-27 NOTE — ED Notes (Signed)
Non stick telfa dressing applied to left side of forehead and wrapped with kling wrap.

## 2019-01-27 NOTE — Discharge Instructions (Addendum)
Stop metoprolol.  Return to the emergency department for severe headache, difficulty waking, seizure activity, or other new and concerning symptoms.

## 2019-06-25 ENCOUNTER — Emergency Department (HOSPITAL_COMMUNITY): Payer: Medicare Other

## 2019-06-25 ENCOUNTER — Encounter (HOSPITAL_COMMUNITY): Payer: Self-pay | Admitting: Emergency Medicine

## 2019-06-25 ENCOUNTER — Emergency Department (HOSPITAL_COMMUNITY)
Admission: EM | Admit: 2019-06-25 | Discharge: 2019-06-25 | Disposition: A | Discharge status: Home / Self Care | Payer: Medicare Other | Attending: Emergency Medicine | Admitting: Emergency Medicine

## 2019-06-25 ENCOUNTER — Other Ambulatory Visit: Payer: Self-pay

## 2019-06-25 DIAGNOSIS — S0990XA Unspecified injury of head, initial encounter: Secondary | ICD-10-CM | POA: Diagnosis present

## 2019-06-25 DIAGNOSIS — Z96642 Presence of left artificial hip joint: Secondary | ICD-10-CM | POA: Diagnosis not present

## 2019-06-25 DIAGNOSIS — Z79899 Other long term (current) drug therapy: Secondary | ICD-10-CM | POA: Diagnosis not present

## 2019-06-25 DIAGNOSIS — S0083XA Contusion of other part of head, initial encounter: Secondary | ICD-10-CM | POA: Insufficient documentation

## 2019-06-25 DIAGNOSIS — W19XXXA Unspecified fall, initial encounter: Secondary | ICD-10-CM

## 2019-06-25 DIAGNOSIS — Y92129 Unspecified place in nursing home as the place of occurrence of the external cause: Secondary | ICD-10-CM | POA: Diagnosis not present

## 2019-06-25 DIAGNOSIS — Y939 Activity, unspecified: Secondary | ICD-10-CM | POA: Diagnosis not present

## 2019-06-25 DIAGNOSIS — I10 Essential (primary) hypertension: Secondary | ICD-10-CM | POA: Diagnosis not present

## 2019-06-25 DIAGNOSIS — W06XXXA Fall from bed, initial encounter: Secondary | ICD-10-CM | POA: Insufficient documentation

## 2019-06-25 DIAGNOSIS — F0390 Unspecified dementia without behavioral disturbance: Secondary | ICD-10-CM | POA: Diagnosis not present

## 2019-06-25 DIAGNOSIS — Z7982 Long term (current) use of aspirin: Secondary | ICD-10-CM | POA: Diagnosis not present

## 2019-06-25 DIAGNOSIS — E039 Hypothyroidism, unspecified: Secondary | ICD-10-CM | POA: Insufficient documentation

## 2019-06-25 DIAGNOSIS — Y999 Unspecified external cause status: Secondary | ICD-10-CM | POA: Insufficient documentation

## 2019-06-25 LAB — CBC WITH DIFFERENTIAL/PLATELET
Abs Immature Granulocytes: 0.02 10*3/uL (ref 0.00–0.07)
Basophils Absolute: 0 10*3/uL (ref 0.0–0.1)
Basophils Relative: 1 %
Eosinophils Absolute: 0.1 10*3/uL (ref 0.0–0.5)
Eosinophils Relative: 2 %
HCT: 42 % (ref 36.0–46.0)
Hemoglobin: 13.5 g/dL (ref 12.0–15.0)
Immature Granulocytes: 0 %
Lymphocytes Relative: 19 %
Lymphs Abs: 1.2 10*3/uL (ref 0.7–4.0)
MCH: 30.5 pg (ref 26.0–34.0)
MCHC: 32.1 g/dL (ref 30.0–36.0)
MCV: 94.8 fL (ref 80.0–100.0)
Monocytes Absolute: 0.4 10*3/uL (ref 0.1–1.0)
Monocytes Relative: 7 %
Neutro Abs: 4.3 10*3/uL (ref 1.7–7.7)
Neutrophils Relative %: 71 %
Platelets: 159 10*3/uL (ref 150–400)
RBC: 4.43 MIL/uL (ref 3.87–5.11)
RDW: 14.1 % (ref 11.5–15.5)
WBC: 6.1 10*3/uL (ref 4.0–10.5)
nRBC: 0 % (ref 0.0–0.2)

## 2019-06-25 LAB — COMPREHENSIVE METABOLIC PANEL
ALT: 25 U/L (ref 0–44)
AST: 24 U/L (ref 15–41)
Albumin: 3.1 g/dL — ABNORMAL LOW (ref 3.5–5.0)
Alkaline Phosphatase: 65 U/L (ref 38–126)
Anion gap: 7 (ref 5–15)
BUN: 17 mg/dL (ref 8–23)
CO2: 28 mmol/L (ref 22–32)
Calcium: 8.8 mg/dL — ABNORMAL LOW (ref 8.9–10.3)
Chloride: 105 mmol/L (ref 98–111)
Creatinine, Ser: 0.58 mg/dL (ref 0.44–1.00)
GFR calc Af Amer: >60 mL/min (ref >60)
GFR calc non Af Amer: >60 mL/min (ref >60)
Glucose, Bld: 82 mg/dL (ref 70–99)
Potassium: 3.9 mmol/L (ref 3.5–5.1)
Sodium: 140 mmol/L (ref 135–145)
Total Bilirubin: 0.7 mg/dL (ref 0.3–1.2)
Total Protein: 5.8 g/dL — ABNORMAL LOW (ref 6.5–8.1)

## 2019-06-25 LAB — URINALYSIS, ROUTINE W REFLEX MICROSCOPIC
Bilirubin Urine: NEGATIVE
Glucose, UA: NEGATIVE mg/dL
Hgb urine dipstick: NEGATIVE
Ketones, ur: NEGATIVE mg/dL
Leukocytes,Ua: NEGATIVE
Nitrite: NEGATIVE
Protein, ur: NEGATIVE mg/dL
Specific Gravity, Urine: 1.01 (ref 1.005–1.030)
pH: 7 (ref 5.0–8.0)

## 2019-06-25 NOTE — ED Provider Notes (Signed)
[facility] EMERGENCY DEPARTMENT Provider Note   CSN: [id number] Arrival date & time: 06/25/19  [id number]     History Chief Complaint  Patient presents with  . Fall    Anita Powell is a 86 y.o. female with a history of dementia presented to emergency department from memory care facility after a mechanical fall out of bed.  This reportedly occurred while the staff attempting to help her put on her TED hose this morning.  She did strike her forehead on the ground.  On exam the patient is demented, arousable but not able to provide consistent history.  She initially arrived with an oral temp of 100F, but then a rectal temp of 41F.  Blood work was ordered based on this temperature.  CT scan of the head and C-spine was also ordered by Dr Tomi Bamberger the overnight EDP.  Patient appears to have a history of borderline sinus bradycardia.  She arrives with a signed DNR form.  HPI     Past Medical History:  Diagnosis Date  . Breast cancer ([place]) 2001   radiation  . Dementia ([place])   . GERD (gastroesophageal reflux disease)   . Hypertension   . Hypothyroidism   . Muscle weakness     Patient Active Problem List   Diagnosis Date Noted  . Closed left hip fracture ([place]) 10/28/2015  . Closed right hip fracture ([place]) 04/07/2015    Past Surgical History:  Procedure Laterality Date  . BREAST EXCISIONAL BIOPSY Left 2001   positive  . HIP ARTHROPLASTY Right 04/08/2015   Procedure: ARTHROPLASTY BIPOLAR HIP (HEMIARTHROPLASTY);  Surgeon: Dereck Leep, MD;  Location: [facility] ORS;  Service: Orthopedics;  Laterality: Right;  . JOINT REPLACEMENT    . Left hip hemiarthroplasty  2013     OB History   No obstetric history on file.     Family History  Problem Relation Age of Onset  . Breast cancer Sister 36  . Breast cancer Paternal Aunt 76    Social History   Tobacco Use  . Smoking status: Never Smoker  . Smokeless tobacco: Never Used  Substance Use Topics  . Alcohol use: Yes    Comment: 3  oz scotch a day   . Drug use: Not on file    Home Medications Prior to Admission medications   Medication Sig Start Date End Date Taking? Authorizing Provider  acetaminophen (TYLENOL) 325 MG tablet Take 2 tablets (650 mg total) by mouth every 6 (six) hours as needed for mild pain (or Fever >/= 101). 04/11/15   Loletha Grayer, MD  alum & mag hydroxide-simeth ([place]) 200-200-20 MG/5ML suspension Take 30 mLs by mouth every 6 (six) hours as needed for indigestion or heartburn.    [provider]  amLODipine (NORVASC) 2.5 MG tablet Take 2.5 mg by mouth daily.    [provider]  aspirin EC 81 MG tablet Take 81 mg by mouth daily.     [provider]  cephALEXin (KEFLEX) 500 MG capsule Take 1 capsule (500 mg total) by mouth 4 (four) times daily. 07/12/17   Lily Kocher, PA-C  cholecalciferol (VITAMIN D) 1000 units tablet Take 2,000 Units by mouth at bedtime.    [provider]  citalopram (CELEXA) 10 MG tablet Take 10 mg by mouth daily.    [provider]  guaifenesin (ROBAFEN) 100 MG/5ML syrup Take 200 mg by mouth every 6 (six) hours as needed for cough.    [provider]  levothyroxine ([place], [place]) 137  MCG tablet Take 137 mcg by mouth daily before breakfast.    [provider]  loperamide (IMODIUM) 2 MG capsule Take 2 mg by mouth as needed for diarrhea or loose stools.    [provider]  magnesium hydroxide (MILK OF MAGNESIA) 400 MG/5ML suspension Take 30 mLs by mouth at bedtime as needed for mild constipation.    [provider]  metoCLOPramide (REGLAN) 10 MG tablet Take 1 tablet (10 mg total) by mouth every 8 (eight) hours as needed for nausea. 08/09/17   Noemi Chapel, MD  metoprolol succinate (TOPROL-XL) 50 MG 24 hr tablet Take 50 mg by mouth daily. Take with or immediately following a meal.    [provider]  omeprazole (PRILOSEC) 20 MG capsule Take 20 mg by mouth daily.    [provider]    Allergies    Patient has no known allergies.  Review of Systems   Review of Systems  Unable to perform ROS: Dementia (level 5 caveat)    Physical Exam Updated Vital Signs BP 102/62   Pulse (!) 39   Temp (!) 97.1 F (36.2 C) (Oral)   Resp 14   Ht 5\' 3"  (1.6 m)   Wt 54.4 kg   SpO2 100%   BMI 21.26 kg/m   Physical Exam Vitals and nursing note reviewed.  Constitutional:      General: She is not in acute distress.    Appearance: She is well-developed.     Comments: Sleeping, arousable  HENT:     Head: Normocephalic.     Comments: Contusion to forehead Eyes:     Conjunctiva/sclera: Conjunctivae normal.  Cardiovascular:     Rate and Rhythm: Normal rate and regular rhythm.     Pulses: Normal pulses.  Pulmonary:     Effort: Pulmonary effort is normal. No respiratory distress.  Abdominal:     Palpations: Abdomen is soft.     Tenderness: There is no abdominal tenderness.  Musculoskeletal:     Cervical back: Neck supple.  Skin:    General: Skin is warm and dry.     ED Results / Procedures / Treatments   Labs (all labs ordered are listed, but only abnormal results are displayed) Labs Reviewed  URINALYSIS, ROUTINE W REFLEX MICROSCOPIC - Abnormal; Notable for the following components:      Result Value   Color, Urine STRAW (*)    All other components within normal limits  COMPREHENSIVE METABOLIC PANEL - Abnormal; Notable for the following components:   Calcium 8.8 (*)    Total Protein 5.8 (*)    Albumin 3.1 (*)    All other components within normal limits  CULTURE, BLOOD (ROUTINE X 2)  CULTURE, BLOOD (ROUTINE X 2)  URINE CULTURE  CBC WITH DIFFERENTIAL/PLATELET    EKG EKG Interpretation  Date/Time:  Friday June 25 2019 06:29:04 EDT Ventricular Rate:  42 PR Interval:    QRS Duration: 110 QT Interval:  499 QTC Calculation: 417 R Axis:   -39 Text Interpretation: Sinus bradycardia Incomplete left bundle branch block Low voltage, precordial  leads Consider anterior infarct Since last tracing rate slower 09 Aug 2017 heart rate was 63 Confirmed by Rolland Porter [phone]) on 06/25/2019 6:34:00 AM   Radiology CT Head Wo Contrast  Result Date: 06/25/2019 CLINICAL DATA:  Fall out of bed EXAM: CT HEAD WITHOUT CONTRAST CT CERVICAL SPINE WITHOUT CONTRAST TECHNIQUE: Multidetector CT imaging of the head and cervical spine was performed following the standard protocol without intravenous contrast.  Multiplanar CT image reconstructions of the cervical spine were also generated. COMPARISON:  01/27/2019 FINDINGS: CT HEAD FINDINGS Brain: No evidence of acute infarction, hemorrhage, hydrocephalus, extra-axial collection or mass lesion/mass effect. Extensive periventricular and deep white matter hypodensity. Vascular: No hyperdense vessel or unexpected calcification. Skull: Normal. Negative for fracture or focal lesion. Sinuses/Orbits: No acute finding. Other: None. CT CERVICAL SPINE FINDINGS Alignment: Normal. Skull base and vertebrae: No acute fracture. No primary bone lesion or focal pathologic process. Soft tissues and spinal canal: No prevertebral fluid or swelling. No visible canal hematoma. Disc levels: Moderate multilevel disc space height loss and osteophytosis of the cervical spine Upper chest: Consolidation and/or fibrosis of the medial left upper lobe as seen on prior examination. Unchanged small nodules of the right pulmonary apex. Other: None. IMPRESSION: 1. No acute intracranial pathology. Extensive small-vessel white matter disease in keeping with advanced patient age. 2. No fracture or static subluxation of the cervical spine. Multilevel disc degenerative disease and osteophytosis. 3. Consolidation and or fibrosis of the medial left upper lobe, as seen on prior examination, of uncertain significance, possibly related to prior infection or surgery. Consider dedicated imaging of the chest on a nonemergent basis when clinically appropriate. Electronically  Signed   By: Eddie Candle M.D.   On: 06/25/2019 08:52   CT Cervical Spine Wo Contrast  Result Date: 06/25/2019 CLINICAL DATA:  Fall out of bed EXAM: CT HEAD WITHOUT CONTRAST CT CERVICAL SPINE WITHOUT CONTRAST TECHNIQUE: Multidetector CT imaging of the head and cervical spine was performed following the standard protocol without intravenous contrast. Multiplanar CT image reconstructions of the cervical spine were also generated. COMPARISON:  01/27/2019 FINDINGS: CT HEAD FINDINGS Brain: No evidence of acute infarction, hemorrhage, hydrocephalus, extra-axial collection or mass lesion/mass effect. Extensive periventricular and deep white matter hypodensity. Vascular: No hyperdense vessel or unexpected calcification. Skull: Normal. Negative for fracture or focal lesion. Sinuses/Orbits: No acute finding. Other: None. CT CERVICAL SPINE FINDINGS Alignment: Normal. Skull base and vertebrae: No acute fracture. No primary bone lesion or focal pathologic process. Soft tissues and spinal canal: No prevertebral fluid or swelling. No visible canal hematoma. Disc levels: Moderate multilevel disc space height loss and osteophytosis of the cervical spine Upper chest: Consolidation and/or fibrosis of the medial left upper lobe as seen on prior examination. Unchanged small nodules of the right pulmonary apex. Other: None. IMPRESSION: 1. No acute intracranial pathology. Extensive small-vessel white matter disease in keeping with advanced patient age. 2. No fracture or static subluxation of the cervical spine. Multilevel disc degenerative disease and osteophytosis. 3. Consolidation and or fibrosis of the medial left upper lobe, as seen on prior examination, of uncertain significance, possibly related to prior infection or surgery. Consider dedicated imaging of the chest on a nonemergent basis when clinically appropriate. Electronically Signed   By: Eddie Candle M.D.   On: 06/25/2019 08:52    Procedures Procedures (including  critical care time)  Medications Ordered in ED Medications - No data to display  ED Course  I have reviewed the triage vital signs and the nursing notes.  Pertinent labs & imaging results that were available during my care of the patient were reviewed by me and considered in my medical decision making (see chart for details).  86 yo female here with mechanical fall from bed, striking forehead on ground today.  She does have advanced dementia, does not communicate much at baseline.  Signed DNR form on arrival.  Overnight EDP did initial MSE and ordered infectious w/u due  to patient's oral temp of 100F, although rectal temp was 73F.  I asked nursing to repeat a temp in 30 minutes.  Labs pending including UA CTH and C spine ordered  Clinical Course as of Jun 24 1813  Fri Jun 25, 2019  0859 1. No acute intracranial pathology. Extensive small-vessel white matter disease in keeping with advanced patient age.  2. No fracture or static subluxation of the cervical spine. Multilevel disc degenerative disease and osteophytosis.  3. Consolidation and or fibrosis of the medial left upper lobe, as seen on prior examination, of uncertain significance, possibly related to prior infection or surgery. Consider dedicated imaging of the chest on a nonemergent basis when clinically appropriate.   [MT]  0859 Repeat temp is within normal limits for her age and frailty, 97.1. With no leukocytosis and no clear infectious source, I am now doubtful this is sepsis.  We will discharge her back to her care facility.   [MT]    Clinical Course User Index [MT] Mayzie Caughlin, Carola Rhine, MD    Final Clinical Impression(s) / ED Diagnoses Final diagnoses:  Contusion of face, initial encounter  Fall, initial encounter    Rx / DC Orders ED Discharge Orders    None       Lytle Mal[place], Carola Rhine, MD 06/25/19 1815

## 2019-06-25 NOTE — Discharge Instructions (Addendum)
Anita Powell's CT scan of the brain does not show signs of a bleed, or a fracture of the cervical spine.  We checked additional labs because her initial temperature was 100F, but on repeat her temperature was 31F.  There was no sign of infection in her urine or your bloodwork.  We felt this may have been an error with her temperature, and therefore felt she was stable for discharge back to the facility.  I included a copy of her lab reports and her CT imaging for her doctor to review.

## 2019-06-25 NOTE — ED Provider Notes (Addendum)
MSE was initiated and I personally evaluated the patient and placed orders (if any) at  6:18 AM on June 25, 2019.  The patient appears stable so that the remainder of the MSE may be completed by another provider.  Patient presents from memory care after falling out of bed while staff were putting on her TED hose.  She has a contusion to her forehead.  Patient does not awaken to communicate during her exam.  She has a faint bruise on her left forehead.  On cardiac exam she has a slow heart rate.  Her abdomen is soft and nontender.  Patient was noted to have a temp orally of 100, rectal temp was ordered.  In and out cath was ordered and blood work.  CT of head and cervical spine was ordered.   06:35 AM HR 40, BP 121/56   EKG Interpretation  Date/Time:  Friday June 25 2019 06:29:04 EDT Ventricular Rate:  42 PR Interval:    QRS Duration: 110 QT Interval:  499 QTC Calculation: 417 R Axis:   -39 Text Interpretation: Sinus bradycardia Incomplete left bundle branch block Low voltage, precordial leads Consider anterior infarct Since last tracing rate slower 09 Aug 2017 heart rate was 63 Confirmed by Rolland Porter [phone]) on 06/25/2019 6:34:00 AM         Patient is DO NOT RESUSCITATE, patient's daughter is her POA   Rolland Porter, MD 06/25/19 [id number]    Rolland Porter, MD 06/25/19 [id number]

## 2019-06-25 NOTE — ED Notes (Signed)
ED Provider at bedside. 

## 2019-06-25 NOTE — ED Triage Notes (Signed)
Pt here due to witnessed fall from low bed to floor. Pt has small area of swelling to right forehead.

## 2019-06-26 LAB — URINE CULTURE
Culture: NO GROWTH
Special Requests: NORMAL

## 2019-06-30 LAB — CULTURE, BLOOD (ROUTINE X 2)
Culture: NO GROWTH
Culture: NO GROWTH
Special Requests: ADEQUATE

## 2019-10-24 IMAGING — DX DG CHEST 2V
2 series · 2 of 2 positions shown · non-contrast
Comparison: 04/07/2015

CLINICAL DATA: Increasing weakness

EXAM:
CHEST - 2 VIEW

[chest lat]
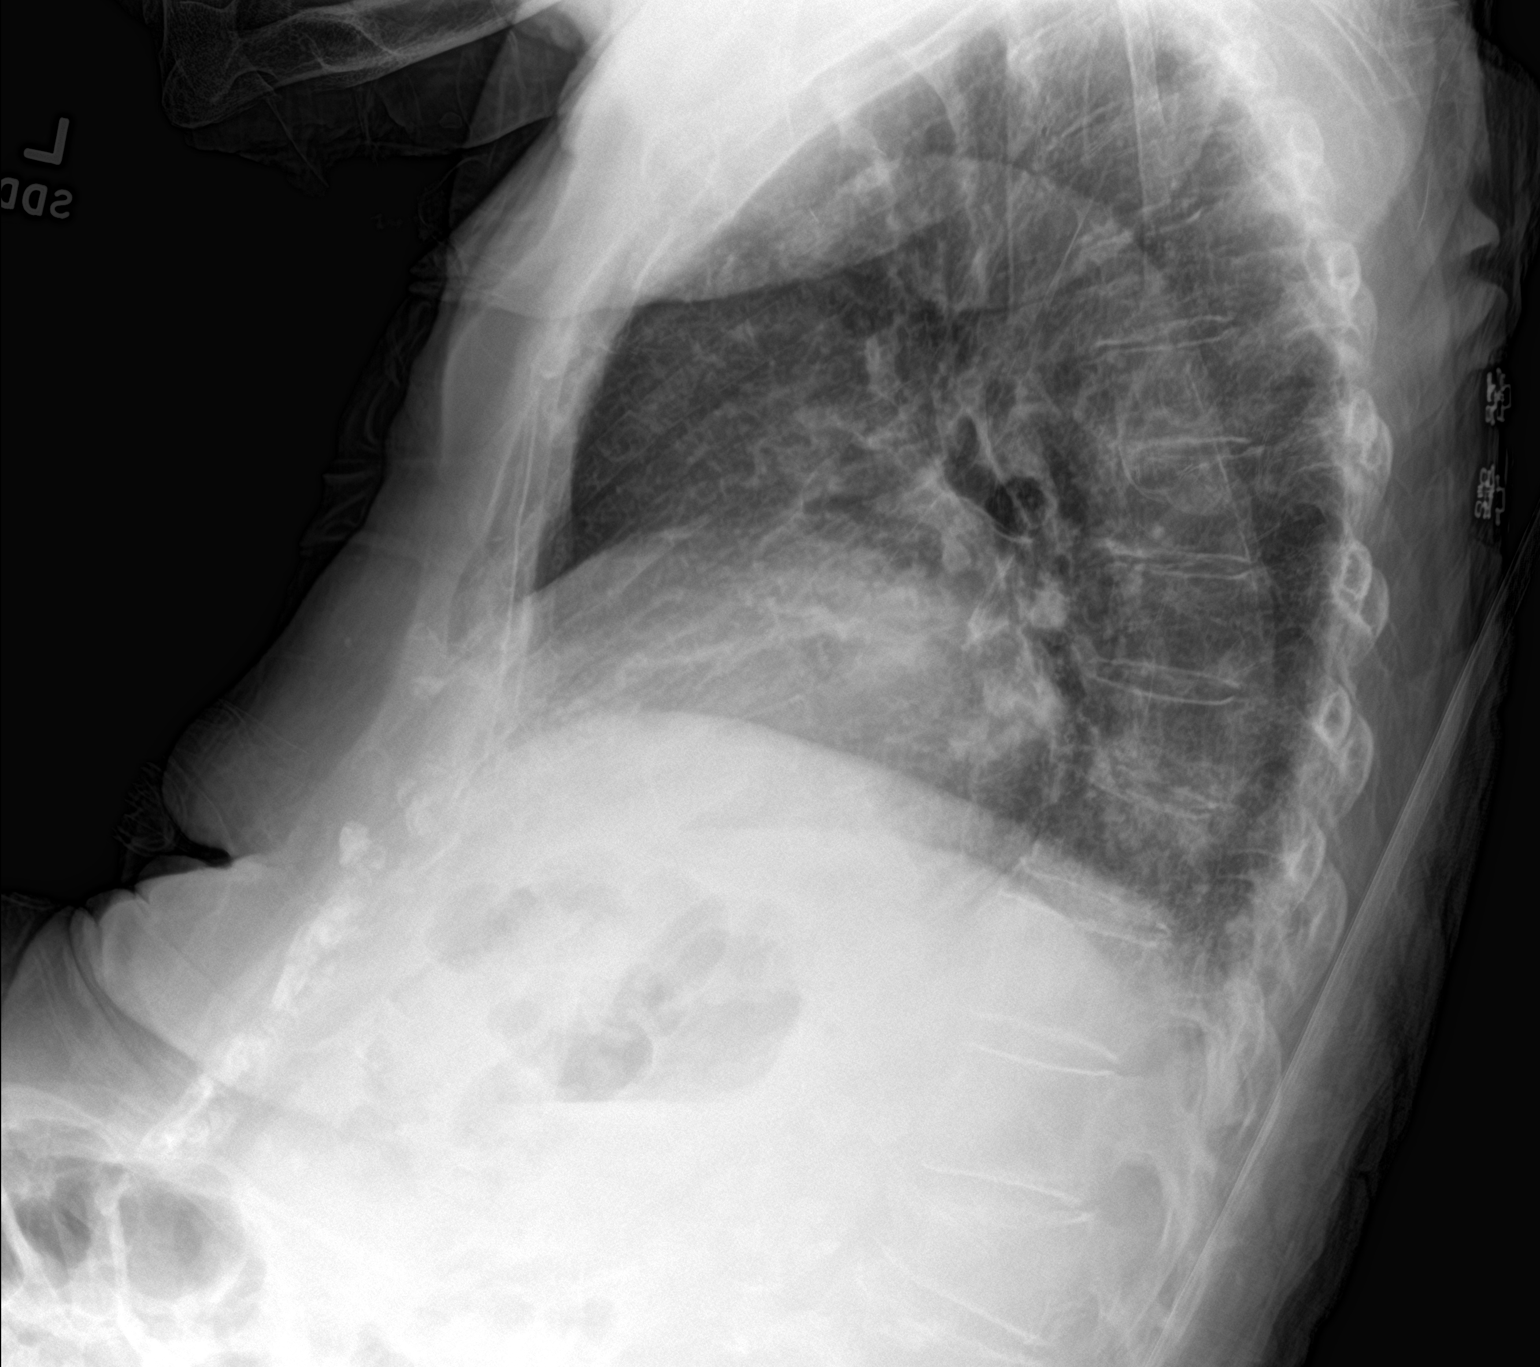

[chest ap]
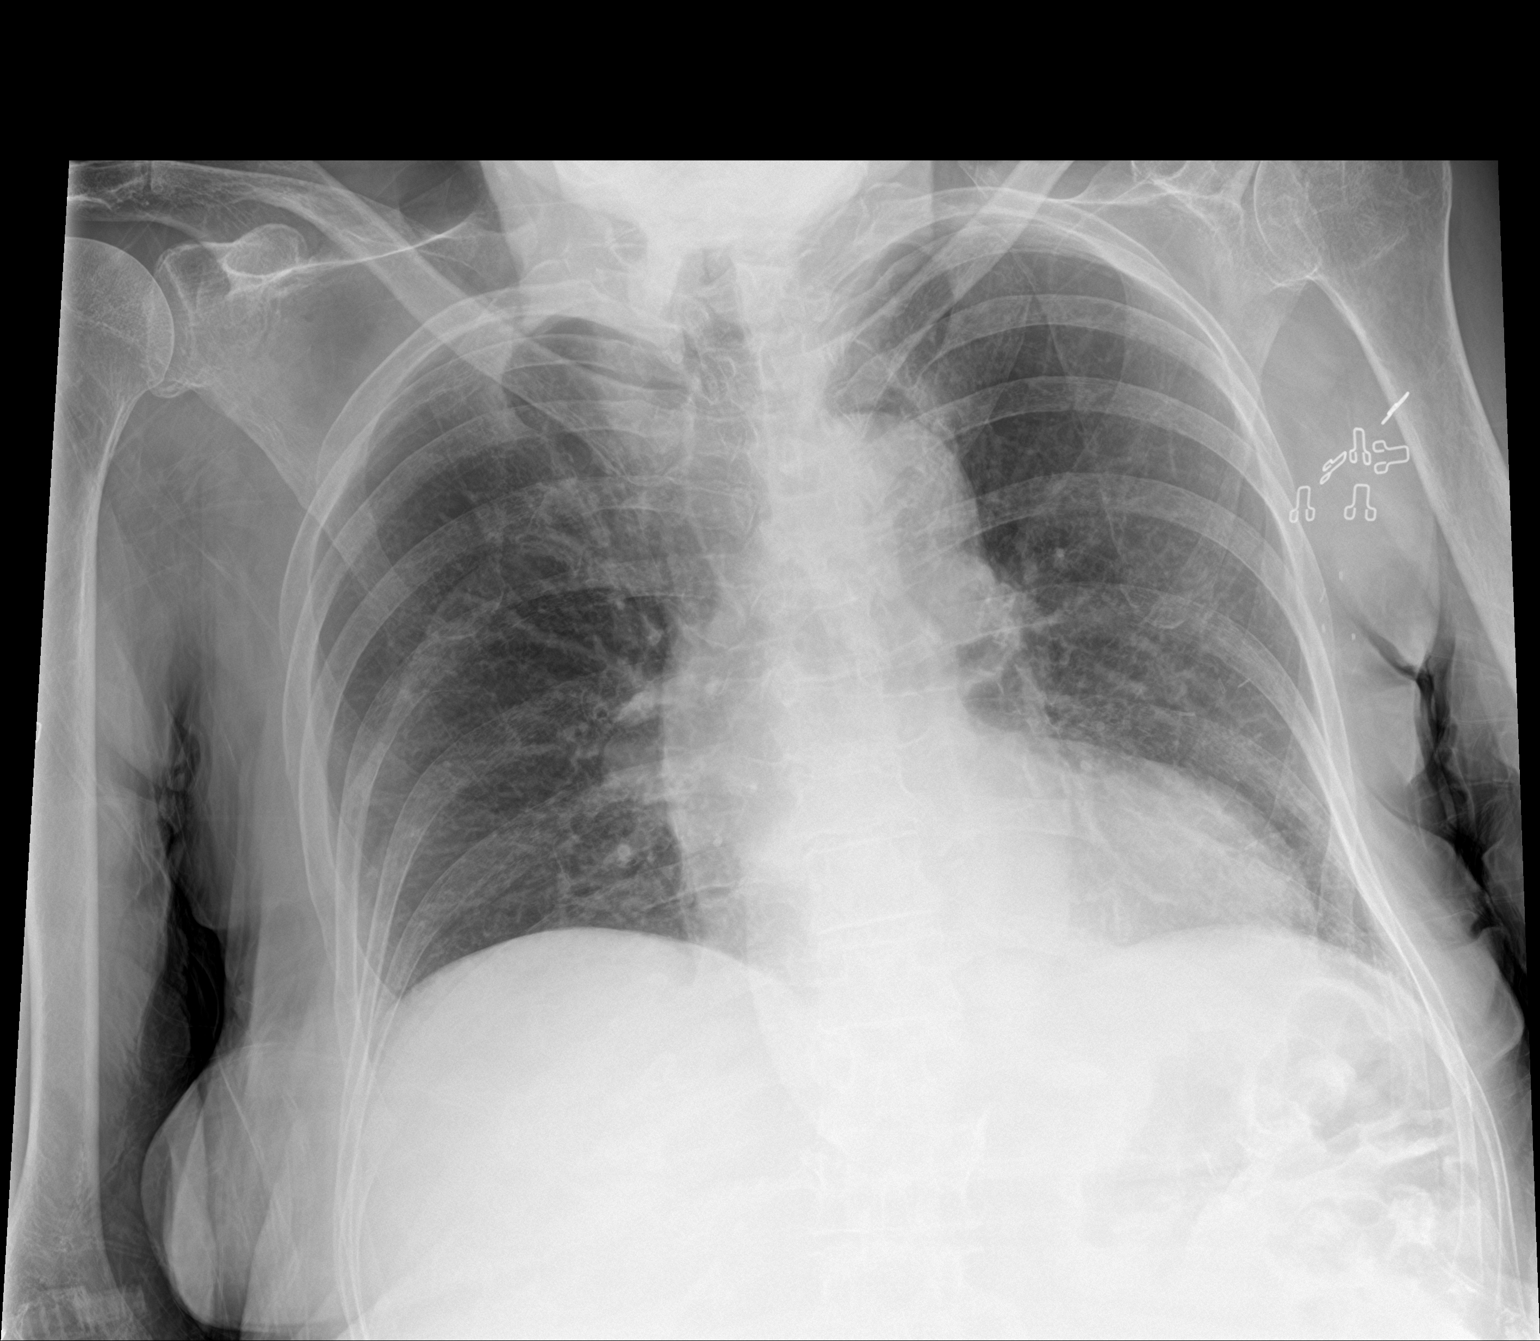

[2 of 2 positions shown; findings below may reference images not displayed]

FINDINGS: Cardiac shadow is mildly enlarged but accentuated by the portable
technique. Lungs are well aerated bilaterally. No focal infiltrate
or sizable effusion is seen. Chronic appearing compression deformity
is noted at the thoracolumbar junction.
IMPRESSION: No acute abnormality noted.

## 2019-10-24 IMAGING — CT CT HEAD W/O CM
3 series · 16 of 47 positions shown, 19 images · non-contrast
Comparison: 05/05/2016

CLINICAL DATA: Altered level of consciousness

EXAM:
CT HEAD WITHOUT CONTRAST
TECHNIQUE: Contiguous axial images were obtained from the base of the skull
through the vertex without intravenous contrast.

[Series 2: head trauma wo · axial · 0.39mm/px · z∈[-30,+95]mm · 10 of 30 slices shown, 13 images]
[im 3/30  brain]
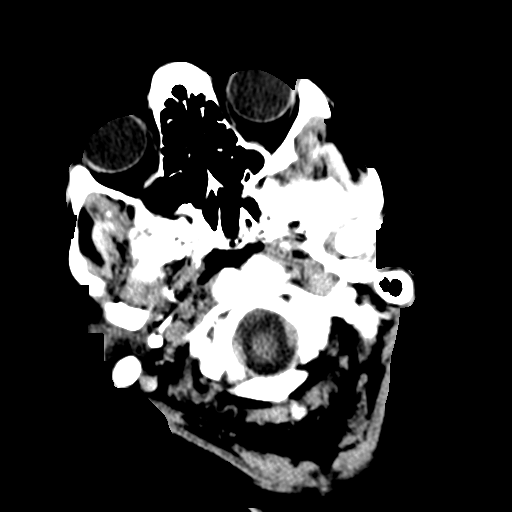
[im 3/30  bone]
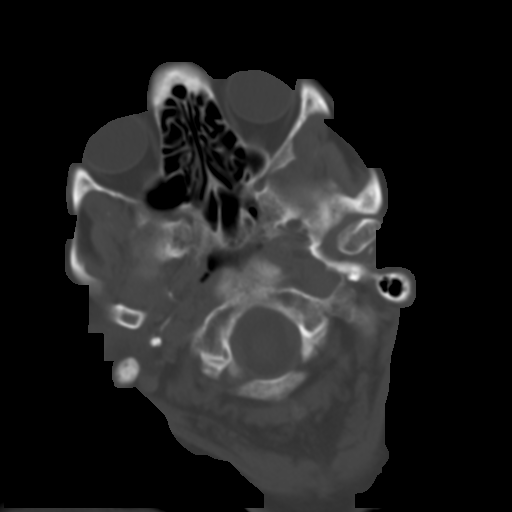
[im 6/30  brain]
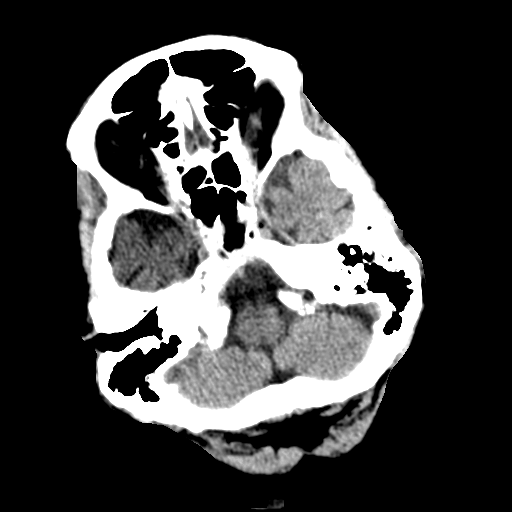
[im 9/30  brain]
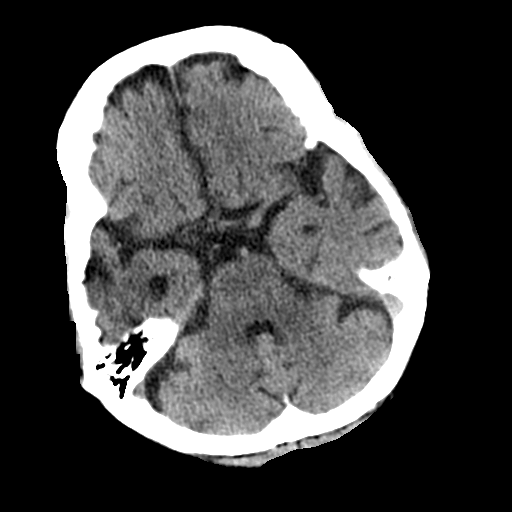
[im 11/30  brain]
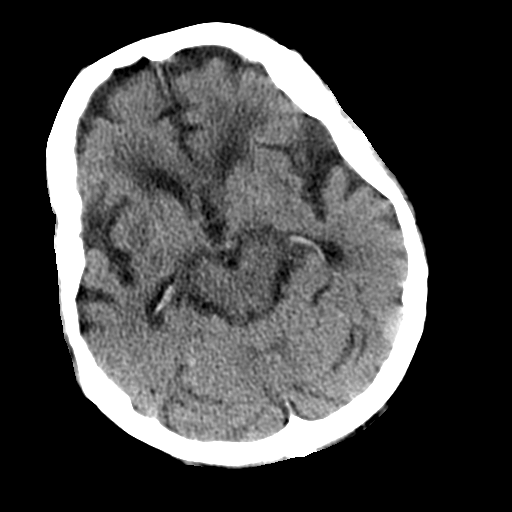
[im 14/30  brain]
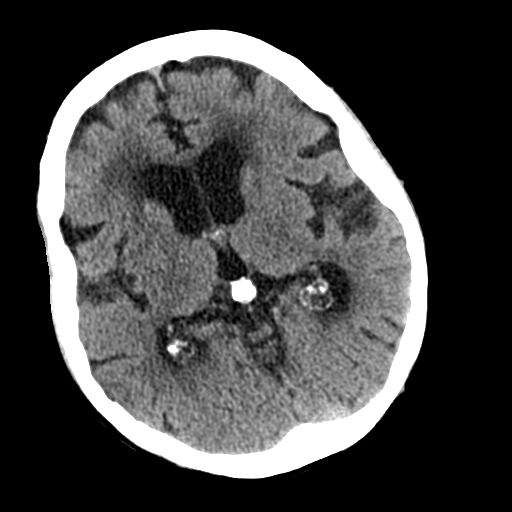
[im 14/30  bone]
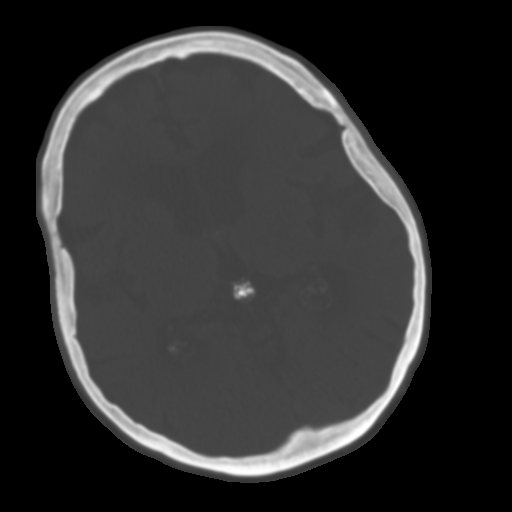
[im 17/30  brain]
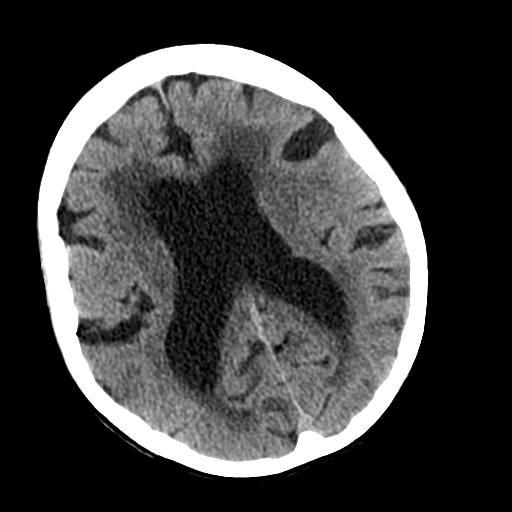
[im 20/30  brain]
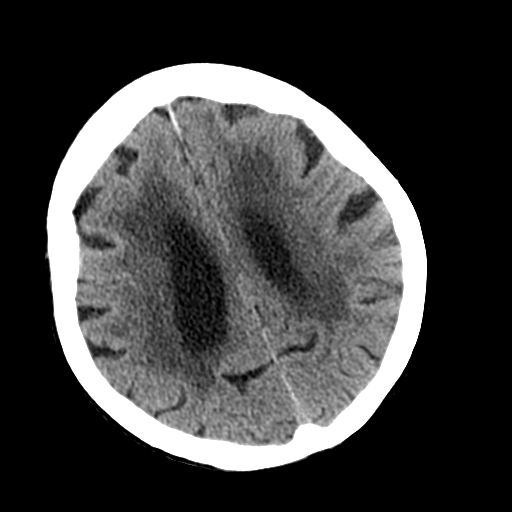
[im 23/30  brain]
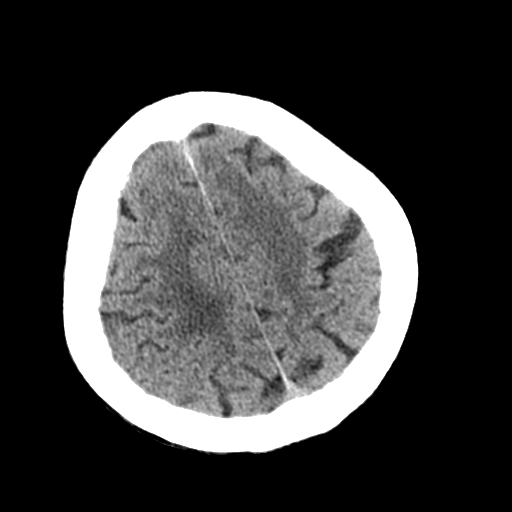
[im 25/30  brain]
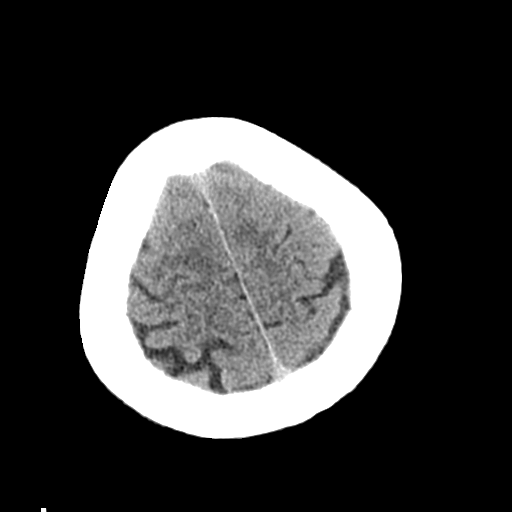
[im 25/30  bone]
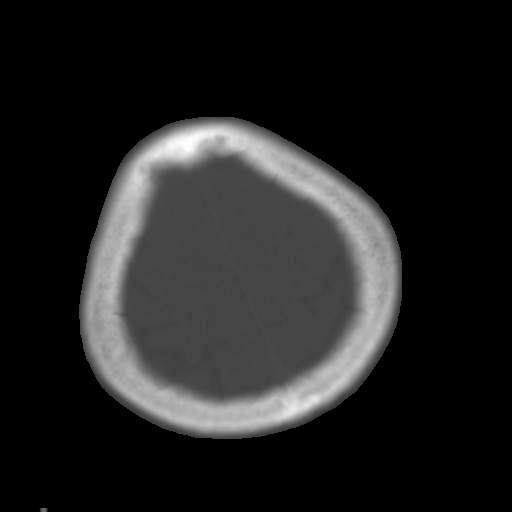
[im 28/30  brain]
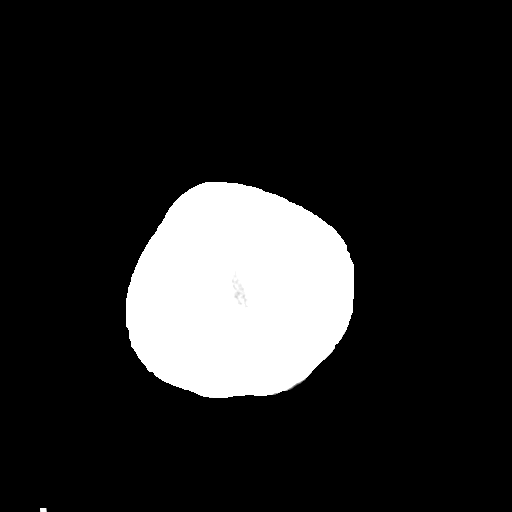

[Series 4: coronal soft tissue · coronal · 0.34mm/px · 3 of 70 slices shown]
[im 24/70  brain]
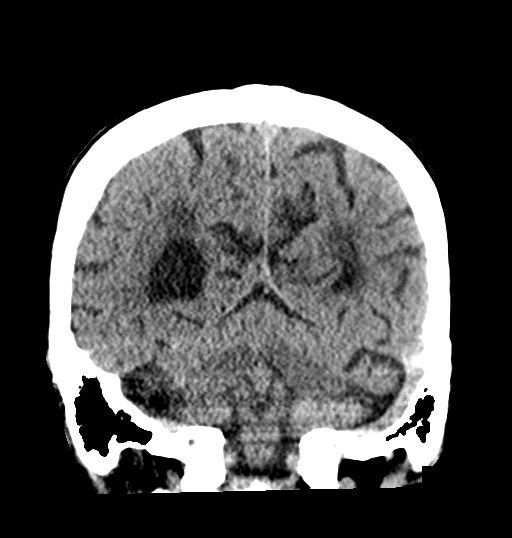
[im 31/70  brain]
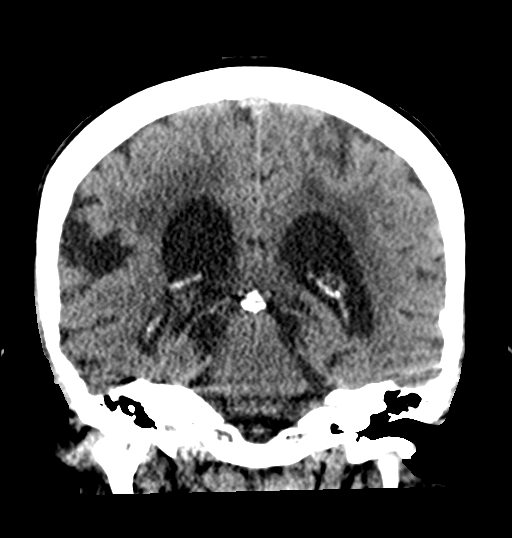
[im 39/70  brain]
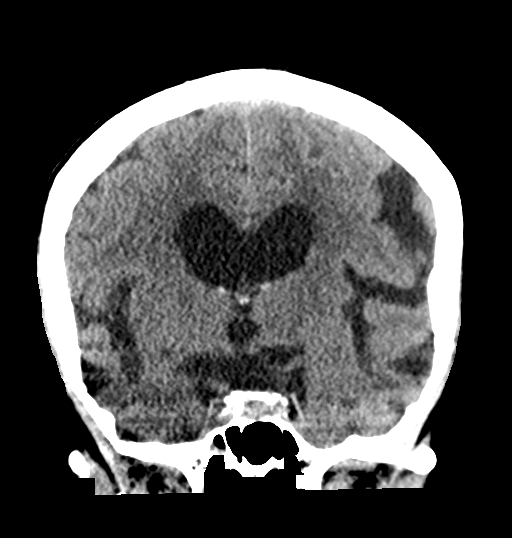

[Series 5: sagittal soft tissue · sagittal · 0.34mm/px · 3 of 60 slices shown]
[im 20/60  brain]
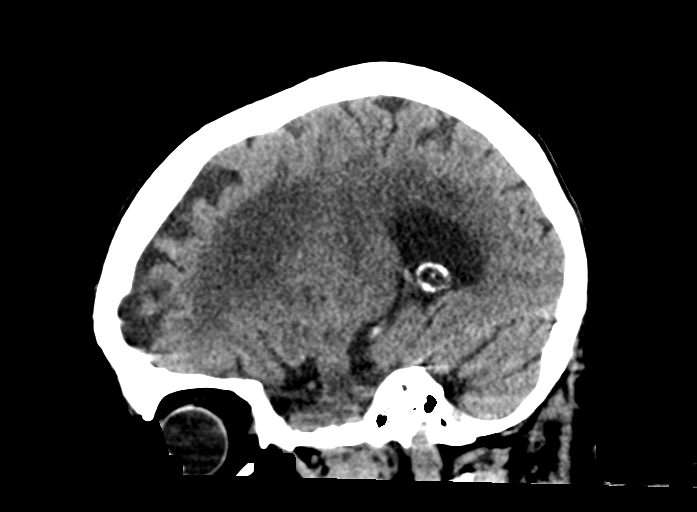
[im 30/60  brain]
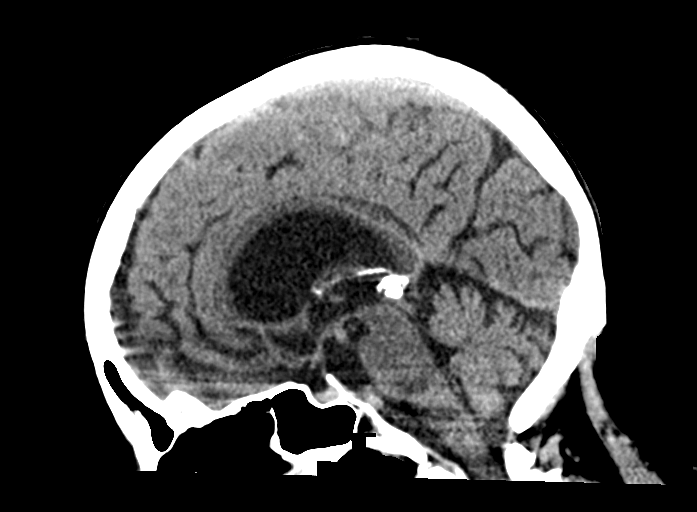
[im 40/60  brain]
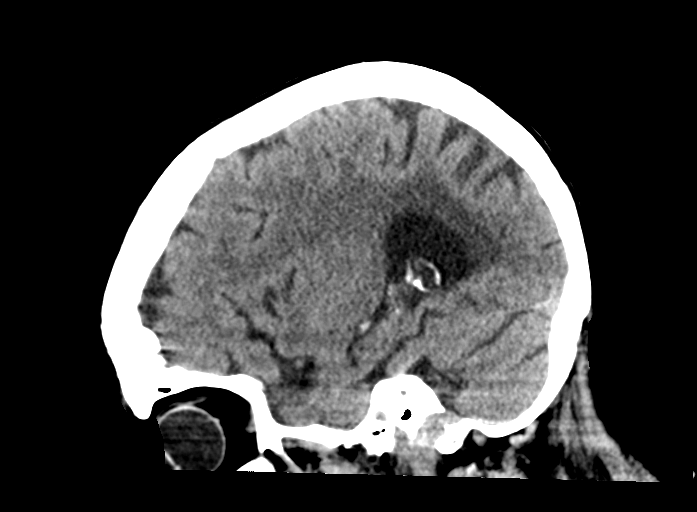

[16 of 47 positions shown; findings below may reference images not displayed]

FINDINGS: Brain: Chronic atrophic and ischemic changes are noted similar to
that seen on the prior exam. No findings to suggest acute
hemorrhage, acute infarction or space-occupying mass lesion are
noted.

Vascular: No hyperdense vessel or unexpected calcification.

Skull: Normal. Negative for fracture or focal lesion.

Sinuses/Orbits: No acute finding.

Other: None.
IMPRESSION: Chronic atrophic and ischemic changes.  No acute abnormality noted.

## 2019-12-05 ENCOUNTER — Inpatient Hospital Stay (HOSPITAL_COMMUNITY)
Admission: EM | Admit: 2019-12-05 | Discharge: 2019-12-07 | DRG: 189 | Disposition: A | Discharge status: Skilled Nursing Facility | Payer: Medicare Other | Source: Skilled Nursing Facility | Attending: Internal Medicine | Admitting: Internal Medicine

## 2019-12-05 ENCOUNTER — Emergency Department (HOSPITAL_COMMUNITY): Payer: Medicare Other

## 2019-12-05 ENCOUNTER — Other Ambulatory Visit: Payer: Self-pay

## 2019-12-05 ENCOUNTER — Encounter (HOSPITAL_COMMUNITY): Payer: Self-pay

## 2019-12-05 DIAGNOSIS — E039 Hypothyroidism, unspecified: Secondary | ICD-10-CM

## 2019-12-05 DIAGNOSIS — I10 Essential (primary) hypertension: Secondary | ICD-10-CM

## 2019-12-05 DIAGNOSIS — Z7982 Long term (current) use of aspirin: Secondary | ICD-10-CM

## 2019-12-05 DIAGNOSIS — F0390 Unspecified dementia without behavioral disturbance: Secondary | ICD-10-CM | POA: Diagnosis present

## 2019-12-05 DIAGNOSIS — J9601 Acute respiratory failure with hypoxia: Principal | ICD-10-CM | POA: Diagnosis present

## 2019-12-05 DIAGNOSIS — Z20822 Contact with and (suspected) exposure to covid-19: Secondary | ICD-10-CM | POA: Diagnosis present

## 2019-12-05 DIAGNOSIS — J9811 Atelectasis: Secondary | ICD-10-CM | POA: Diagnosis present

## 2019-12-05 DIAGNOSIS — F419 Anxiety disorder, unspecified: Secondary | ICD-10-CM | POA: Diagnosis present

## 2019-12-05 DIAGNOSIS — F32A Depression, unspecified: Secondary | ICD-10-CM | POA: Diagnosis present

## 2019-12-05 DIAGNOSIS — Z79899 Other long term (current) drug therapy: Secondary | ICD-10-CM

## 2019-12-05 DIAGNOSIS — J90 Pleural effusion, not elsewhere classified: Secondary | ICD-10-CM | POA: Diagnosis present

## 2019-12-05 DIAGNOSIS — Z66 Do not resuscitate: Secondary | ICD-10-CM | POA: Diagnosis present

## 2019-12-05 DIAGNOSIS — Z803 Family history of malignant neoplasm of breast: Secondary | ICD-10-CM

## 2019-12-05 DIAGNOSIS — J910 Malignant pleural effusion: Secondary | ICD-10-CM | POA: Diagnosis present

## 2019-12-05 DIAGNOSIS — D509 Iron deficiency anemia, unspecified: Secondary | ICD-10-CM | POA: Diagnosis present

## 2019-12-05 DIAGNOSIS — Z79890 Hormone replacement therapy (postmenopausal): Secondary | ICD-10-CM | POA: Diagnosis not present

## 2019-12-05 DIAGNOSIS — Z9889 Other specified postprocedural states: Secondary | ICD-10-CM | POA: Diagnosis not present

## 2019-12-05 DIAGNOSIS — Z853 Personal history of malignant neoplasm of breast: Secondary | ICD-10-CM | POA: Diagnosis not present

## 2019-12-05 DIAGNOSIS — K219 Gastro-esophageal reflux disease without esophagitis: Secondary | ICD-10-CM | POA: Diagnosis present

## 2019-12-05 LAB — COMPREHENSIVE METABOLIC PANEL
ALT: 15 U/L (ref 0–44)
AST: 15 U/L (ref 15–41)
Albumin: 2.7 g/dL — ABNORMAL LOW (ref 3.5–5.0)
Alkaline Phosphatase: 70 U/L (ref 38–126)
Anion gap: 16 — ABNORMAL HIGH (ref 5–15)
BUN: 22 mg/dL (ref 8–23)
CO2: 28 mmol/L (ref 22–32)
Calcium: 8.7 mg/dL — ABNORMAL LOW (ref 8.9–10.3)
Chloride: 101 mmol/L (ref 98–111)
Creatinine, Ser: 0.48 mg/dL (ref 0.44–1.00)
GFR calc Af Amer: >60 mL/min (ref >60)
GFR calc non Af Amer: >60 mL/min (ref >60)
Glucose, Bld: 124 mg/dL — ABNORMAL HIGH (ref 70–99)
Potassium: 3.3 mmol/L — ABNORMAL LOW (ref 3.5–5.1)
Sodium: 145 mmol/L (ref 135–145)
Total Bilirubin: 0.6 mg/dL (ref 0.3–1.2)
Total Protein: 6.5 g/dL (ref 6.5–8.1)

## 2019-12-05 LAB — CBC WITH DIFFERENTIAL/PLATELET
Abs Immature Granulocytes: 0.05 10*3/uL (ref 0.00–0.07)
Basophils Absolute: 0 10*3/uL (ref 0.0–0.1)
Basophils Relative: 0 %
Eosinophils Absolute: 0.4 10*3/uL (ref 0.0–0.5)
Eosinophils Relative: 4 %
HCT: 26.1 % — ABNORMAL LOW (ref 36.0–46.0)
Hemoglobin: 8.3 g/dL — ABNORMAL LOW (ref 12.0–15.0)
Immature Granulocytes: 1 %
Lymphocytes Relative: 10 %
Lymphs Abs: 1 10*3/uL (ref 0.7–4.0)
MCH: 29.7 pg (ref 26.0–34.0)
MCHC: 31.8 g/dL (ref 30.0–36.0)
MCV: 93.5 fL (ref 80.0–100.0)
Monocytes Absolute: 0.9 10*3/uL (ref 0.1–1.0)
Monocytes Relative: 9 %
Neutro Abs: 7.7 10*3/uL (ref 1.7–7.7)
Neutrophils Relative %: 76 %
Platelets: 410 10*3/uL — ABNORMAL HIGH (ref 150–400)
RBC: 2.79 MIL/uL — ABNORMAL LOW (ref 3.87–5.11)
RDW: 13.8 % (ref 11.5–15.5)
WBC: 10.1 10*3/uL (ref 4.0–10.5)
nRBC: 0.2 % (ref 0.0–0.2)

## 2019-12-05 LAB — LACTIC ACID, PLASMA: Lactic Acid, Venous: 1.7 mmol/L (ref 0.5–1.9)

## 2019-12-05 LAB — RESPIRATORY PANEL BY RT PCR (FLU A&B, COVID)
Influenza A by PCR: NEGATIVE
Influenza B by PCR: NEGATIVE
SARS Coronavirus 2 by RT PCR: NEGATIVE

## 2019-12-05 MED ORDER — LEVOTHYROXINE SODIUM 100 MCG PO TABS
200.0000 ug | ORAL_TABLET | Freq: Every day | ORAL | Status: DC
  Administered 2019-12-06 – 2019-12-07 (×2): 200 ug via ORAL
  Filled 2019-12-05 (×2): qty 2

## 2019-12-05 MED ORDER — ONDANSETRON HCL 4 MG PO TABS: 4.0000 mg | ORAL_TABLET | Freq: Four times a day (QID) | ORAL | Status: DC | PRN

## 2019-12-05 MED ORDER — ENSURE ENLIVE PO LIQD
Freq: Three times a day (TID) | ORAL | Status: DC
  Filled 2019-12-05 (×2): qty 237

## 2019-12-05 MED ORDER — IPRATROPIUM-ALBUTEROL 0.5-2.5 (3) MG/3ML IN SOLN
3.0000 mL | Freq: Once | RESPIRATORY_TRACT | Status: AC
  Administered 2019-12-05: 3 mL via RESPIRATORY_TRACT
  Filled 2019-12-05: qty 3

## 2019-12-05 MED ORDER — POTASSIUM CHLORIDE 20 MEQ PO PACK
40.0000 meq | PACK | Freq: Once | ORAL | Status: AC
  Administered 2019-12-05: 40 meq via ORAL
  Filled 2019-12-05: qty 2

## 2019-12-05 MED ORDER — ACETAMINOPHEN 650 MG RE SUPP: 650.0000 mg | Freq: Four times a day (QID) | RECTAL | Status: DC | PRN

## 2019-12-05 MED ORDER — CITALOPRAM HYDROBROMIDE 10 MG PO TABS
10.0000 mg | ORAL_TABLET | Freq: Every day | ORAL | Status: DC
  Administered 2019-12-05 – 2019-12-06 (×2): 10 mg via ORAL
  Filled 2019-12-05 (×3): qty 1

## 2019-12-05 MED ORDER — SODIUM CHLORIDE 0.9 % IV SOLN
2.0000 g | Freq: Once | INTRAVENOUS | Status: AC
  Administered 2019-12-05: 2 g via INTRAVENOUS
  Filled 2019-12-05: qty 2

## 2019-12-05 MED ORDER — BUSPIRONE HCL 5 MG PO TABS
10.0000 mg | ORAL_TABLET | Freq: Every morning | ORAL | Status: DC
  Administered 2019-12-06 – 2019-12-07 (×2): 10 mg via ORAL
  Filled 2019-12-05 (×2): qty 2

## 2019-12-05 MED ORDER — IPRATROPIUM-ALBUTEROL 0.5-2.5 (3) MG/3ML IN SOLN: 3.0000 mL | Freq: Four times a day (QID) | RESPIRATORY_TRACT | Status: DC | PRN

## 2019-12-05 MED ORDER — IOHEXOL 300 MG/ML  SOLN
75.0000 mL | Freq: Once | INTRAMUSCULAR | Status: AC | PRN
  Administered 2019-12-05: 75 mL via INTRAVENOUS

## 2019-12-05 MED ORDER — ONDANSETRON HCL 4 MG/2ML IJ SOLN: 4.0000 mg | Freq: Four times a day (QID) | INTRAMUSCULAR | Status: DC | PRN

## 2019-12-05 MED ORDER — VANCOMYCIN HCL 500 MG/100ML IV SOLN: 500.0000 mg | INTRAVENOUS | Status: DC

## 2019-12-05 MED ORDER — VANCOMYCIN HCL IN DEXTROSE 1-5 GM/200ML-% IV SOLN
1000.0000 mg | Freq: Once | INTRAVENOUS | Status: AC
  Administered 2019-12-05: 1000 mg via INTRAVENOUS
  Filled 2019-12-05: qty 200

## 2019-12-05 MED ORDER — ACETAMINOPHEN 325 MG PO TABS: 650.0000 mg | ORAL_TABLET | Freq: Four times a day (QID) | ORAL | Status: DC | PRN

## 2019-12-05 MED ORDER — PANTOPRAZOLE SODIUM 40 MG PO TBEC
40.0000 mg | DELAYED_RELEASE_TABLET | Freq: Every day | ORAL | Status: DC
  Administered 2019-12-05 – 2019-12-07 (×3): 40 mg via ORAL
  Filled 2019-12-05 (×3): qty 1

## 2019-12-05 MED ORDER — POLYETHYLENE GLYCOL 3350 17 G PO PACK: 17.0000 g | PACK | Freq: Every day | ORAL | Status: DC | PRN

## 2019-12-05 MED ORDER — GUAIFENESIN-DM 100-10 MG/5ML PO SYRP: 10.0000 mL | ORAL_SOLUTION | Freq: Three times a day (TID) | ORAL | Status: DC | PRN

## 2019-12-05 NOTE — Progress Notes (Signed)
Pharmacy Antibiotic Note  ZAKARIAH DEJARNETTE is a 86 y.o. female admitted on 12/05/2019 with pneumonia.  Pharmacy has been consulted for vancomycin dosing.   Plan: vancomycin 1gm x 1  Vancomycin 500mg  IV every 24 hours.  Goal trough 15-20 mcg/mL.  Weight: 58 kg (127 lb 13.9 oz)  Temp (24hrs), Avg:97.6 F (36.4 C), Min:97.5 F (36.4 C), Max:97.7 F (36.5 C)  Recent Labs  Lab 12/05/19 1352  WBC 10.1  CREATININE 0.48  LATICACIDVEN 1.7    Estimated Creatinine Clearance: 38.7 mL/min (by C-G formula based on SCr of 0.48 mg/dL).    No Known Allergies  Antimicrobials this admission: 10/3 vancomycin >> 10/3 cefepime >>   Microbiology results: 10/3 Covid 19: negative  10/3 Bcx: sent   Thank you for allowing pharmacy to be a part of this patient's care.  Donna Christen Jaquasia Doscher 12/05/2019 2:46 PM

## 2019-12-05 NOTE — ED Notes (Signed)
Pt placed on 2L [place] at this time for a room SpO2 of 88%. With 2L [place], SpO2 increased to 98%.

## 2019-12-05 NOTE — ED Notes (Signed)
Attempted to call report x 1  

## 2019-12-05 NOTE — ED Notes (Signed)
Pt transported to CT by CT staff via stretcher.

## 2019-12-05 NOTE — ED Notes (Signed)
X Ray at bedside at this time.  

## 2019-12-05 NOTE — ED Notes (Signed)
Entered room and introduced self to patient. Pt appears to be resting in bed with no signs of acute distress noted. Respirations are even and unlabored with equal chest rise and fall. Bed is locked in the lowest position, side rails x2, call bell within reach. Pt provided with a pillow and blanket at this time. Pt placed on continuous cardiac monitoring at this time.

## 2019-12-05 NOTE — ED Triage Notes (Addendum)
Pt was brought in from [place] due to increased work of breathing . Pt reports productive cough for 2 weeks. sats on room air 95 % EMS reports that there had been cold like symptoms going through the facility but no covid

## 2019-12-05 NOTE — ED Provider Notes (Signed)
[facility] EMERGENCY DEPARTMENT Provider Note   CSN: [id number] Arrival date & time: 12/05/19  1045     History Chief Complaint  Patient presents with  . Cough    Anita Powell is a 86 y.o. female.  Patient with increased shortness of breath and productive cough for 2 weeks.  No fever  The history is provided by the patient, the nursing home and the EMS personnel. No language interpreter was used.  Cough Cough characteristics:  Productive Sputum characteristics:  Nondescript Severity:  Moderate Onset quality:  Sudden Timing:  Constant Progression:  Worsening Chronicity:  New Smoker: no   Context: not animal exposure   Relieved by:  Nothing Worsened by:  Nothing Ineffective treatments:  None tried Associated symptoms: no chest pain, no eye discharge, no headaches and no rash        Past Medical History:  Diagnosis Date  . Breast cancer ([place]) 2001   radiation  . Dementia ([place])   . GERD (gastroesophageal reflux disease)   . Hypertension   . Hypothyroidism   . Muscle weakness     Patient Active Problem List   Diagnosis Date Noted  . Closed left hip fracture ([place]) 10/28/2015  . Closed right hip fracture ([place]) 04/07/2015    Past Surgical History:  Procedure Laterality Date  . BREAST EXCISIONAL BIOPSY Left 2001   positive  . HIP ARTHROPLASTY Right 04/08/2015   Procedure: ARTHROPLASTY BIPOLAR HIP (HEMIARTHROPLASTY);  Surgeon: Dereck Leep, MD;  Location: [facility] ORS;  Service: Orthopedics;  Laterality: Right;  . JOINT REPLACEMENT    . Left hip hemiarthroplasty  2013     OB History   No obstetric history on file.     Family History  Problem Relation Age of Onset  . Breast cancer Sister 41  . Breast cancer Paternal Aunt 27    Social History   Tobacco Use  . Smoking status: Never Smoker  . Smokeless tobacco: Never Used  Substance Use Topics  . Alcohol use: Yes    Comment: 3 oz scotch a day   . Drug use: Not on file    Home Medications Prior  to Admission medications   Medication Sig Start Date End Date Taking? Authorizing Provider  acetaminophen (TYLENOL) 500 MG tablet Take 500 mg by mouth every 6 (six) hours as needed for mild pain or headache (fever >101 or severe discomfort).   Yes [provider]  alum & mag hydroxide-simeth ([place]) 200-200-20 MG/5ML suspension Take 30 mLs by mouth every 6 (six) hours as needed for indigestion or heartburn.   Yes [provider]  aspirin EC 81 MG tablet Take 81 mg by mouth daily.    Yes [provider]  busPIRone (BUSPAR) 10 MG tablet Take 10 mg by mouth in the morning.   Yes [provider]  Cholecalciferol (VITAMIN D) 125 MCG (5000 UT) CAPS Take 5,000 Units by mouth in the morning.    Yes [provider]  citalopram (CELEXA) 10 MG tablet Take 10 mg by mouth at bedtime.    Yes [provider]  levothyroxine (SYNTHROID) 200 MCG tablet Take 200 mcg by mouth daily before breakfast.    Yes [provider]  loperamide (IMODIUM) 2 MG capsule Take 2 mg by mouth as needed for diarrhea or loose stools.   Yes [provider]  magnesium hydroxide (MILK OF MAGNESIA) 400 MG/5ML suspension Take 30 mLs by mouth at bedtime as needed for mild constipation.   Yes [provider]  metoCLOPramide (REGLAN) 10 MG tablet Take 1 tablet (10 mg total) by mouth every 8 (eight) hours as needed for nausea. 08/09/17  Yes Noemi Chapel, MD  neomycin-bacitracin-polymyxin (NEOSPORIN) [phone] ointment Apply 1 application topically as needed (minor skin tears or abrasions).   Yes [provider]  Nutritional Supplements (NUTRITIONAL SHAKE PO) Take 118 mLs by mouth 3 (three) times daily with meals.   Yes [provider]  omeprazole (PRILOSEC) 20 MG capsule Take 20 mg by mouth at bedtime.    Yes [provider]  vitamin B-12 (CYANOCOBALAMIN) 1000 MCG tablet Take 1,000 mcg by mouth in the morning.   Yes [provider]    acetaminophen (TYLENOL) 325 MG tablet Take 2 tablets (650 mg total) by mouth every 6 (six) hours as needed for mild pain (or Fever >/= 101). Patient not taking: Reported on 12/05/2019 04/11/15   Loletha Grayer, MD    Allergies    Patient has no known allergies.  Review of Systems   Review of Systems  Constitutional: Negative for appetite change and fatigue.  HENT: Negative for congestion, ear discharge and sinus pressure.   Eyes: Negative for discharge.  Respiratory: Positive for cough.   Cardiovascular: Negative for chest pain.  Gastrointestinal: Negative for abdominal pain and diarrhea.  Genitourinary: Negative for frequency and hematuria.  Musculoskeletal: Negative for back pain.  Skin: Negative for rash.  Neurological: Negative for seizures and headaches.  Psychiatric/Behavioral: Negative for hallucinations.    Physical Exam Updated Vital Signs BP 115/79 (BP Location: Left Arm)   Pulse 75   Temp 97.7 F (36.5 C) (Oral)   Resp 16   Wt 58 kg   SpO2 98%   BMI 22.65 kg/m   Physical Exam Vitals reviewed.  Constitutional:      Appearance: She is well-developed.  HENT:     Head: Normocephalic.     Nose: Nose normal.  Eyes:     General: No scleral icterus.    Conjunctiva/sclera: Conjunctivae normal.  Neck:     Thyroid: No thyromegaly.  Cardiovascular:     Rate and Rhythm: Normal rate and regular rhythm.     Heart sounds: No murmur heard.  No friction rub. No gallop.   Pulmonary:     Breath sounds: No stridor. No wheezing or rales.  Chest:     Chest wall: No tenderness.  Abdominal:     General: There is no distension.     Tenderness: There is no abdominal tenderness. There is no rebound.  Musculoskeletal:        General: Normal range of motion.     Cervical back: Neck supple.  Lymphadenopathy:     Cervical: No cervical adenopathy.  Skin:    Findings: No erythema or rash.  Neurological:     Motor: No abnormal muscle tone.     Coordination: Coordination  normal.     Comments: Oriented to person and place only  Psychiatric:        Behavior: Behavior normal.     ED Results / Procedures / Treatments   Labs (all labs ordered are listed, but only abnormal results are displayed) Labs Reviewed  CBC WITH DIFFERENTIAL/PLATELET - Abnormal; Notable for the following components:      Result Value   RBC 2.79 (*)    Hemoglobin 8.3 (*)    HCT 26.1 (*)    Platelets 410 (*)    All other components within normal limits  COMPREHENSIVE METABOLIC PANEL - Abnormal; Notable for the  following components:   Potassium 3.3 (*)    Glucose, Bld 124 (*)    Calcium 8.7 (*)    Albumin 2.7 (*)    Anion gap 16 (*)    All other components within normal limits  RESPIRATORY PANEL BY RT PCR (FLU A&B, COVID)  CULTURE, BLOOD (ROUTINE X 2)  CULTURE, BLOOD (ROUTINE X 2)  MRSA PCR SCREENING  LACTIC ACID, PLASMA    EKG None  Radiology CT Chest W Contrast  Result Date: 12/05/2019 CLINICAL DATA:  Shortness of breath. EXAM: CT CHEST WITH CONTRAST TECHNIQUE: Multidetector CT imaging of the chest was performed during intravenous contrast administration. CONTRAST:  19mL OMNIPAQUE IOHEXOL 300 MG/ML  SOLN COMPARISON:  None. FINDINGS: Cardiovascular: There is moderate severity calcification of the aortic arch. Normal heart size with moderate severity coronary artery calcification. A very small amount of pericardial fluid is seen. Mediastinum/Nodes: Mild left to right shift of the mediastinal structures is noted. No enlarged mediastinal, hilar, or axillary lymph nodes. Thyroid gland, trachea, and esophagus demonstrate no significant findings. Lungs/Pleura: Complete collapse of the left lung is seen. A very large left pleural effusion is also noted. Multiple heterogeneous pleural based soft tissue masses of various sizes are seen on the left. No pneumothorax is seen. Upper Abdomen: Early liver cirrhosis is seen. Musculoskeletal: A chronic deformity is seen involving the body of the  sternum. Chronic compression fracture deformities are seen at the levels of T8, T12 and L1. Multilevel degenerative changes are noted throughout the remainder of the thoracic spine. IMPRESSION: 1. Complete collapse of the left lung with a very large left pleural effusion. 2. Multiple heterogeneous pleural based soft tissue masses of various sizes on the left, likely consistent with an underlying neoplastic process. Correlation with a nuclear medicine PET/CT is recommended. 3. Early liver cirrhosis. 4. Chronic compression fracture deformities of the thoracic and lumbar spine. 5. Aortic atherosclerosis. Aortic Atherosclerosis (ICD10-I70.0). Electronically Signed   By: Virgina Norfolk M.D.   On: 12/05/2019 15:27   DG Chest Port 1 View  Result Date: 12/05/2019 CLINICAL DATA:  Weakness EXAM: PORTABLE CHEST 1 VIEW COMPARISON:  June 20, 2017 FINDINGS: There is complete opacification of the LEFT hemithorax. Cardiomediastinal silhouette is obscured. The RIGHT lung is clear. Evaluation for volume loss or mediastinal deviation is limited secondary to patient rotation. Multilevel degenerative changes of the thoracic spine. Wedging of a vertebral body at the thoracolumbar junction. IMPRESSION: 1. Complete opacification of the LEFT hemithorax. This is favored to be due to a combination of pleural effusion and atelectasis. Differential considerations include complete lobar collapse or infection. Consider further evaluation with dedicated CT. Electronically Signed   By: Valentino Saxon MD   On: 12/05/2019 11:45    Procedures Procedures (including critical care time)  Medications Ordered in ED Medications  vancomycin (VANCOCIN) IVPB 1000 mg/200 mL premix (has no administration in time range)  vancomycin (VANCOREADY) IVPB 500 mg/100 mL (has no administration in time range)  ceFEPIme (MAXIPIME) 2 g in sodium chloride 0.9 % 100 mL IVPB (0 g Intravenous Stopped 12/05/19 1500)  iohexol (OMNIPAQUE) 300 MG/ML solution  75 mL (75 mLs Intravenous Contrast Given 12/05/19 1506)  CRITICAL CARE Performed by: Milton Ferguson Total critical care time: 40 minutes Critical care time was exclusive of separately billable procedures and treating other patients. Critical care was necessary to treat or prevent imminent or life-threatening deterioration. Critical care was time spent personally by me on the following activities: development of treatment plan with patient and/or surrogate as  well as nursing, discussions with consultants, evaluation of patient's response to treatment, examination of patient, obtaining history from patient or surrogate, ordering and performing treatments and interventions, ordering and review of laboratory studies, ordering and review of radiographic studies, pulse oximetry and re-evaluation of patient's condition.   ED Course  I have reviewed the triage vital signs and the nursing notes.  Pertinent labs & imaging results that were available during my care of the patient were reviewed by me and considered in my medical decision making (see chart for details).    MDM Rules/Calculators/A&P                          Patient with complete white out of her left lung consistent with collapse of left lung with very large left pleural effusion.  She also has pleural-based soft tissue masses which are consistent with metastatic disease.  She will be admitted to medicine for further work-up      This patient presents to the ED for concern of cough, this involves an extensive number of treatment options, and is a complaint that carries with it a high risk of complications and morbidity.  The differential diagnosis includes pneumonia bronchitis   Lab Tests:   I Ordered, reviewed, and interpreted labs, which included CBC chemistries which showed anemia  Medicines ordered:   I ordered medication thank and Maxipime for pneumonia  Imaging Studies ordered:   I ordered imaging studies which included  chest x-ray  I independently visualized and interpreted imaging which showed possible pneumonia and effusion to left lung.  CT scan shows possible cancer  Additional history obtained:   Additional history obtained from records  Previous records obtained and reviewed.  Consultations Obtained:   I consulted hospitalist and discussed lab and imaging findings  Reevaluation:  After the interventions stated above, I reevaluated the patient and found unchanged  Critical Interventions:  .   Final Clinical Impression(s) / ED Diagnoses Final diagnoses:  None    Rx / DC Orders ED Discharge Orders    None       Milton Ferguson, MD 12/08/19 0840

## 2019-12-05 NOTE — H&P (Signed)
History and Physical    Anita Powell ZDG:[id number] DOB: 08-22-1926 DOA: 12/05/2019  PCP: Patient, No Pcp Per   Patient coming from: [facility]  I have personally briefly reviewed patient's old medical records in [place]  Chief Complaint: Difficulty breathing, Cough X 2 weeks.  HPI: Anita Powell is a 86 y.o. female with medical history significant for breast cancer, dementia, hypertension, hypothyroidism.  Patient was brought to the ED with complaints of increasing difficulty breathing and cough over the past 2 weeks.  At the time of my evaluation, patient is able to provide limited history.  She tells me her cough is more severe than the difficulty breathing. She denies black stools, blood in stools or vomiting of blood.    ED Course: O2 sats down to 88% at the time of my evaluation, Temperature 97.5.  WBC 10.1.  Potassium 3.3.  Hemoglobin down to 8.3.  Lactic acid 1.7.  Covid and influenza test negative.  Vancomycin and cefepime started.  Portable chest x-ray shows complete opacification of the left lung, subsequent CT chest with contrast-complete collapse of left lung with very large left pleural effusion, multiple heterogeneous pleural-based soft tissue masses of various sizes on the left likely consistent with underlying neoplastic process.  PET/CT recommended. Hospitalist to admit for further evaluation and management.  Review of Systems: As per HPI all other systems reviewed and negative.  Past Medical History:  Diagnosis Date  . Breast cancer ([place]) 2001   radiation  . Dementia ([place])   . GERD (gastroesophageal reflux disease)   . Hypertension   . Hypothyroidism   . Muscle weakness     Past Surgical History:  Procedure Laterality Date  . BREAST EXCISIONAL BIOPSY Left 2001   positive  . HIP ARTHROPLASTY Right 04/08/2015   Procedure: ARTHROPLASTY BIPOLAR HIP (HEMIARTHROPLASTY);  Surgeon: Dereck Leep, MD;  Location: [facility] ORS;  Service: Orthopedics;   Laterality: Right;  . JOINT REPLACEMENT    . Left hip hemiarthroplasty  2013     reports that she has never smoked. She has never used smokeless tobacco. She reports current alcohol use. No history on file for drug use.  No Known Allergies  Family History  Problem Relation Age of Onset  . Breast cancer Sister 51  . Breast cancer Paternal Aunt 41    Prior to Admission medications   Medication Sig Start Date End Date Taking? Authorizing Provider  acetaminophen (TYLENOL) 500 MG tablet Take 500 mg by mouth every 6 (six) hours as needed for mild pain or headache (fever >101 or severe discomfort).   Yes [provider]  alum & mag hydroxide-simeth ([place]) 200-200-20 MG/5ML suspension Take 30 mLs by mouth every 6 (six) hours as needed for indigestion or heartburn.   Yes [provider]  aspirin EC 81 MG tablet Take 81 mg by mouth daily.    Yes [provider]  busPIRone (BUSPAR) 10 MG tablet Take 10 mg by mouth in the morning.   Yes [provider]  Cholecalciferol (VITAMIN D) 125 MCG (5000 UT) CAPS Take 5,000 Units by mouth in the morning.    Yes [provider]  citalopram (CELEXA) 10 MG tablet Take 10 mg by mouth at bedtime.    Yes [provider]  levothyroxine (SYNTHROID) 200 MCG tablet Take 200 mcg by mouth daily before breakfast.    Yes [provider]  loperamide (IMODIUM) 2 MG capsule Take 2 mg by mouth as needed for diarrhea or loose  stools.   Yes [provider]  magnesium hydroxide (MILK OF MAGNESIA) 400 MG/5ML suspension Take 30 mLs by mouth at bedtime as needed for mild constipation.   Yes [provider]  metoCLOPramide (REGLAN) 10 MG tablet Take 1 tablet (10 mg total) by mouth every 8 (eight) hours as needed for nausea. 08/09/17  Yes Noemi Chapel, MD  neomycin-bacitracin-polymyxin (NEOSPORIN) [phone] ointment Apply 1 application topically as needed (minor skin tears or abrasions).   Yes [provider]  Nutritional Supplements (NUTRITIONAL SHAKE PO) Take 118 mLs by mouth 3 (three) times daily with meals.   Yes [provider]  omeprazole (PRILOSEC) 20 MG capsule Take 20 mg by mouth at bedtime.    Yes [provider]  vitamin B-12 (CYANOCOBALAMIN) 1000 MCG tablet Take 1,000 mcg by mouth in the morning.   Yes [provider]  acetaminophen (TYLENOL) 325 MG tablet Take 2 tablets (650 mg total) by mouth every 6 (six) hours as needed for mild pain (or Fever >/= 101). Patient not taking: Reported on 12/05/2019 04/11/15   Loletha Grayer, MD    Physical Exam: Vitals:   12/05/19 1058 12/05/19 1431  BP: 129/85 115/79  Pulse: 76 75  Resp: 16 16  Temp: (!) 97.5 F (36.4 C) 97.7 F (36.5 C)  TempSrc: Oral Oral  SpO2: 93% 98%  Weight:  58 kg    Constitutional: NAD, calm, comfortable Vitals:   12/05/19 1058 12/05/19 1431  BP: 129/85 115/79  Pulse: 76 75  Resp: 16 16  Temp: (!) 97.5 F (36.4 C) 97.7 F (36.5 C)  TempSrc: Oral Oral  SpO2: 93% 98%  Weight:  58 kg   Eyes: PERRL, lids and conjunctivae normal ENMT: Mucous membranes are mildly dry Neck: normal, supple, no masses, no thyromegaly Respiratory:  Normal respiratory effort. No accessory muscle use.  Cardiovascular: Regular rate and rhythm, pitting edema bilateral feet, trace to +1 pitting edema to mid leg.  Abdomen: no tenderness, no masses palpated. No hepatosplenomegaly. Bowel sounds positive.  Musculoskeletal: no clubbing / cyanosis. No joint deformity upper and lower extremities. Good ROM, no contractures. Normal muscle tone.  Skin: no rashes, lesions, ulcers. No induration Neurologic: No apparent cranial nerve abnormality, 5/5 strength in all extremities. Psych-alert and oriented to person, able to spell her name, answers a few questions appropriately, most times patient stopped midsentence and asked what the question is or cannot remember her train of thought.  Able to follow some  directions but with clarification and repetition.  Talk to patient's daughter on the phone this is patient's baseline.  Labs on Admission: I have personally reviewed following labs and imaging studies  CBC: Recent Labs  Lab 12/05/19 1352  WBC 10.1  NEUTROABS 7.7  HGB 8.3*  HCT 26.1*  MCV 93.5  PLT 272*   Basic Metabolic Panel: Recent Labs  Lab 12/05/19 1352  NA 145  K 3.3*  CL 101  CO2 28  GLUCOSE 124*  BUN 22  CREATININE 0.48  CALCIUM 8.7*   Liver Function Tests: Recent Labs  Lab 12/05/19 1352  AST 15  ALT 15  ALKPHOS 70  BILITOT 0.6  PROT 6.5  ALBUMIN 2.7*    Radiological Exams on Admission: CT Chest W Contrast  Result Date: 12/05/2019 CLINICAL DATA:  Shortness of breath. EXAM: CT CHEST WITH CONTRAST TECHNIQUE: Multidetector CT imaging of the chest was performed during intravenous contrast administration. CONTRAST:  18mL OMNIPAQUE IOHEXOL 300 MG/ML  SOLN COMPARISON:  None. FINDINGS: Cardiovascular: There is  moderate severity calcification of the aortic arch. Normal heart size with moderate severity coronary artery calcification. A very small amount of pericardial fluid is seen. Mediastinum/Nodes: Mild left to right shift of the mediastinal structures is noted. No enlarged mediastinal, hilar, or axillary lymph nodes. Thyroid gland, trachea, and esophagus demonstrate no significant findings. Lungs/Pleura: Complete collapse of the left lung is seen. A very large left pleural effusion is also noted. Multiple heterogeneous pleural based soft tissue masses of various sizes are seen on the left. No pneumothorax is seen. Upper Abdomen: Early liver cirrhosis is seen. Musculoskeletal: A chronic deformity is seen involving the body of the sternum. Chronic compression fracture deformities are seen at the levels of T8, T12 and L1. Multilevel degenerative changes are noted throughout the remainder of the thoracic spine. IMPRESSION: 1. Complete collapse of the left lung with a very  large left pleural effusion. 2. Multiple heterogeneous pleural based soft tissue masses of various sizes on the left, likely consistent with an underlying neoplastic process. Correlation with a nuclear medicine PET/CT is recommended. 3. Early liver cirrhosis. 4. Chronic compression fracture deformities of the thoracic and lumbar spine. 5. Aortic atherosclerosis. Aortic Atherosclerosis (ICD10-I70.0). Electronically Signed   By: Virgina Norfolk M.D.   On: 12/05/2019 15:27   DG Chest Port 1 View  Result Date: 12/05/2019 CLINICAL DATA:  Weakness EXAM: PORTABLE CHEST 1 VIEW COMPARISON:  June 20, 2017 FINDINGS: There is complete opacification of the LEFT hemithorax. Cardiomediastinal silhouette is obscured. The RIGHT lung is clear. Evaluation for volume loss or mediastinal deviation is limited secondary to patient rotation. Multilevel degenerative changes of the thoracic spine. Wedging of a vertebral body at the thoracolumbar junction. IMPRESSION: 1. Complete opacification of the LEFT hemithorax. This is favored to be due to a combination of pleural effusion and atelectasis. Differential considerations include complete lobar collapse or infection. Consider further evaluation with dedicated CT. Electronically Signed   By: Valentino Saxon MD   On: 12/05/2019 11:45    EKG: None.  Assessment/Plan Principal Problem:   Large pleural effusion Active Problems:   Dementia (H[facility])   Essential hypertension   Hypothyroidism   Large pleural effusion with acute hypoxic respiratory failure-presenting with dyspnea, cough, O2 sats down to 88% currently on nasal cannula.  WBC 10.1.  Afebrile.  Chest CT with contrast- Complete collapse of the left lung with a very large left pleural Effusion, soft tissue masses of her sinus on the left, likely consistent with an underlying neoplastic process.  Also with acute anemia.,  Hemoglobin drop of ~ 5 pts. Covid and influenza test negative. -Ultrasound-guided thoracentesis in  the morning -Pleural fluid analysis -Supplemental oxygen -Mucolytic's as needed -DuoNebs as needed. -Hold off of further antibiotics at this time.  Acute anemia-hemoglobin down to 8.3 from baseline ~ 13, checked 5 months ago, 06/2019.  Denies GI blood loss.  Home medications include 81 mg aspirin. -CBC in the morning -Anemia panel in the morning -Hold aspirin for now  Dementia-resident of [place].  Alert, following some directions with clarification and repetition, able to give limited history.  Needs assistance with ADLs.  Talked to patient's daughter on the phone- [name], she reports this is patient's baseline.  Patient's dementia is advanced.  She can recognize family. -Resume BuSpar, Celexa.  Hypertension-stable.  Not on antihypertensives.  Hypothyroidism -Resume home Synthroid  DVT prophylaxis: SCDs for now-planned thoracentesis and with acute anemia. Code Status: DNR universal form at bedside Family Communication: Talked to patient's daughter Lattie Corns on the phone,  contact #[phone].  She is patient's power of attorney. Also confirmed patient's DNR status with daughter.  Plan of care, explained all questions answered. Disposition Plan:  ~ 2 days, pending respiratory status, thoracentesis. Consults called: None Admission status: Inpatient, telemetry I certify that at the point of admission it is my clinical judgment that the patient will require inpatient hospital care spanning beyond 2 midnights from the point of admission due to high intensity of service, high risk for further deterioration and high frequency of surveillance required. The following factors support the patient status of inpatient: Acute respiratory failure requiring supplemental oxygen, and need for thoracentesis.  Bethena Roys MD [facility]  12/05/2019, 5:44 PM

## 2019-12-06 ENCOUNTER — Inpatient Hospital Stay (HOSPITAL_COMMUNITY): Payer: Medicare Other

## 2019-12-06 ENCOUNTER — Encounter (HOSPITAL_COMMUNITY): Payer: Self-pay | Admitting: Internal Medicine

## 2019-12-06 DIAGNOSIS — J90 Pleural effusion, not elsewhere classified: Secondary | ICD-10-CM

## 2019-12-06 DIAGNOSIS — Z9889 Other specified postprocedural states: Secondary | ICD-10-CM

## 2019-12-06 DIAGNOSIS — F0390 Unspecified dementia without behavioral disturbance: Secondary | ICD-10-CM

## 2019-12-06 DIAGNOSIS — E039 Hypothyroidism, unspecified: Secondary | ICD-10-CM

## 2019-12-06 DIAGNOSIS — I10 Essential (primary) hypertension: Secondary | ICD-10-CM

## 2019-12-06 LAB — BASIC METABOLIC PANEL
Anion gap: 10 (ref 5–15)
BUN: 18 mg/dL (ref 8–23)
CO2: 28 mmol/L (ref 22–32)
Calcium: 8.6 mg/dL — ABNORMAL LOW (ref 8.9–10.3)
Chloride: 100 mmol/L (ref 98–111)
Creatinine, Ser: 0.51 mg/dL (ref 0.44–1.00)
GFR calc Af Amer: >60 mL/min (ref >60)
GFR calc non Af Amer: >60 mL/min (ref >60)
Glucose, Bld: 152 mg/dL — ABNORMAL HIGH (ref 70–99)
Potassium: 3.8 mmol/L (ref 3.5–5.1)
Sodium: 138 mmol/L (ref 135–145)

## 2019-12-06 LAB — CBC
HCT: 25.7 % — ABNORMAL LOW (ref 36.0–46.0)
Hemoglobin: 7.8 g/dL — ABNORMAL LOW (ref 12.0–15.0)
MCH: 28.6 pg (ref 26.0–34.0)
MCHC: 30.4 g/dL (ref 30.0–36.0)
MCV: 94.1 fL (ref 80.0–100.0)
Platelets: 399 10*3/uL (ref 150–400)
RBC: 2.73 MIL/uL — ABNORMAL LOW (ref 3.87–5.11)
RDW: 13.8 % (ref 11.5–15.5)
WBC: 11.5 10*3/uL — ABNORMAL HIGH (ref 4.0–10.5)
nRBC: 0.3 % — ABNORMAL HIGH (ref 0.0–0.2)

## 2019-12-06 LAB — BODY FLUID CELL COUNT WITH DIFFERENTIAL
Eos, Fluid: 7 %
Lymphs, Fluid: 31 %
Monocyte-Macrophage-Serous Fluid: 11 % — ABNORMAL LOW (ref 50–90)
Neutrophil Count, Fluid: 51 % — ABNORMAL HIGH (ref 0–25)
Other Cells, Fluid: 0 %
Total Nucleated Cell Count, Fluid: 2564 cu mm — ABNORMAL HIGH (ref 0–1000)

## 2019-12-06 LAB — GLUCOSE, PLEURAL OR PERITONEAL FLUID: Glucose, Fluid: 44 mg/dL

## 2019-12-06 LAB — IRON AND TIBC
Iron: 18 ug/dL — ABNORMAL LOW (ref 28–170)
Saturation Ratios: 7 % — ABNORMAL LOW (ref 10.4–31.8)
TIBC: 270 ug/dL (ref 250–450)
UIBC: 252 ug/dL

## 2019-12-06 LAB — RETICULOCYTES
Immature Retic Fract: 31.3 % — ABNORMAL HIGH (ref 2.3–15.9)
RBC.: 2.67 MIL/uL — ABNORMAL LOW (ref 3.87–5.11)
Retic Count, Absolute: 153.8 10*3/uL (ref 19.0–186.0)
Retic Ct Pct: 5.8 % — ABNORMAL HIGH (ref 0.4–3.1)

## 2019-12-06 LAB — FOLATE: Folate: 6.5 ng/mL (ref >5.9)

## 2019-12-06 LAB — FERRITIN: Ferritin: 246 ng/mL (ref 11–307)

## 2019-12-06 LAB — GRAM STAIN

## 2019-12-06 LAB — LACTATE DEHYDROGENASE, PLEURAL OR PERITONEAL FLUID: LD, Fluid: 1262 U/L — ABNORMAL HIGH (ref 3–23)

## 2019-12-06 LAB — VITAMIN B12: Vitamin B-12: 1347 pg/mL — ABNORMAL HIGH (ref 180–914)

## 2019-12-06 NOTE — Procedures (Signed)
PreOperative Dx: LT pleural effusion Postoperative Dx: LT pleural effusion Procedure:   US guided LT thoracentesis Radiologist:  Thornton Papas Anesthesia:  10 ml of 1% lidocaine Specimen:  1.2 L of dark old bloody colored fluid EBL:   < 1 ml Complications:  None

## 2019-12-06 NOTE — TOC Initial Note (Signed)
Transition of Care [facility]) - Initial/Assessment Note    Patient Details  Name: Anita Powell MRN: [id number] Date of Birth: [birth year]  Transition of Care [facility]) CM/SW Contact:    Salome Arnt, [place] Phone Number: 12/06/2019, 1:48 PM  Clinical Narrative:  Pt admitted due to large pleural effusion with acute hypoxic respiratory failure. Pt is resident at [facility]. LCSW spoke with pt's daughter, [name] who requests return to [facility] when medically stable as long as pt's needs can be met at this level of care. LCSW also spoke to [place] at [facility]. Pt is on memory care unit and has been a resident at facility since 2017. Pt requires max assist with ADLs. She transfers to wheelchair with assist. Per Zigmund Daniel, okay for return. TOC will continue to follow.                  Expected Discharge Plan: Assisted Living Barriers to Discharge: Continued Medical Work up   Patient Goals and CMS Choice Patient states their goals for this hospitalization and ongoing recovery are:: return to ALF      Expected Discharge Plan and Services Expected Discharge Plan: Assisted Living In-house Referral: Clinical Social Work     Living arrangements for the past 2 months: [place]                                      Prior Living Arrangements/Services Living arrangements for the past 2 months: [place] Lives with:: Facility Resident Patient language and need for interpreter reviewed:: Yes Do you feel safe going back to the place where you live?: Yes      Need for Family Participation in Patient Care: Yes (Comment) Care giver support system in place?: Yes (comment) (ALF resident) Current home services: DME (wheelchair) Criminal Activity/Legal Involvement Pertinent to Current Situation/Hospitalization: No - Comment as needed  Activities of Daily Living   ADL Screening (condition at time of admission) Patient's cognitive ability adequate to  safely complete daily activities?: No Is the patient deaf or have difficulty hearing?: No Does the patient have difficulty seeing, even when wearing glasses/contacts?: No Does the patient have difficulty concentrating, remembering, or making decisions?: Yes Patient able to express need for assistance with ADLs?: Yes Does the patient have difficulty dressing or bathing?: Yes Independently performs ADLs?: No Communication: Independent Dressing (OT): Needs assistance Is this a change from baseline?: Pre-admission baseline Grooming: Needs assistance Is this a change from baseline?: Pre-admission baseline Feeding: Independent Bathing: Needs assistance Is this a change from baseline?: Pre-admission baseline Toileting: Needs assistance Is this a change from baseline?: Pre-admission baseline In/Out Bed: Needs assistance Is this a change from baseline?: Pre-admission baseline Walks in Home: Needs assistance Is this a change from baseline?: Pre-admission baseline Does the patient have difficulty walking or climbing stairs?: Yes Weakness of Legs: Both Weakness of Arms/Hands: None  Permission Sought/Granted         Permission granted to share info w AGENCY: [place] granted to share info w Relationship: ALF     Emotional Assessment   Attitude/Demeanor/Rapport: Unable to Assess Affect (typically observed): Unable to Assess Orientation: : Oriented to Self Alcohol / Substance Use: Not Applicable Psych Involvement: No (comment)  Admission diagnosis:  Large pleural effusion [J90] Patient Active Problem List   Diagnosis Date Noted  . Large pleural effusion 12/05/2019  . Dementia ([place]) 12/05/2019  .  Essential hypertension 12/05/2019  . Hypothyroidism 12/05/2019  . Closed left hip fracture ([place]) 10/28/2015  . Closed right hip fracture ([place]) 04/07/2015   PCP:  Patient, No Pcp Per Pharmacy:  No Pharmacies Listed    Social Determinants of Health (SDOH)  Interventions    Readmission Risk Interventions No flowsheet data found.

## 2019-12-06 NOTE — Sedation Documentation (Signed)
PT tolerated Left sided thoracentesis well today and 1.2L of dark bloody fluid removed. Vitals signs stable at completion of procedure today and patient taken to chest xray at this time. Prior to procedure consent verified with patient and daughter (POA) via telephone. Labs drawn and sent to lab. PT returned to in-patient unit.

## 2019-12-06 NOTE — Progress Notes (Signed)
PROGRESS NOTE    Anita Powell  OAC:[id number] DOB: [birth year] DOA: 12/05/2019 PCP: Patient, No Pcp Per    Chief Complaint  Patient presents with  . Cough    Brief Narrative:  As per H&P written by Dr. Denton Brick on 01/01/2020 Anita Powell is a 86 y.o. female with medical history significant for breast cancer, dementia, hypertension, hypothyroidism.  Patient was brought to the ED with complaints of increasing difficulty breathing and cough over the past 2 weeks.  At the time of my evaluation, patient is able to provide limited history.  She tells me her cough is more severe than the difficulty breathing. She denies black stools, blood in stools or vomiting of blood.    ED Course: O2 sats down to 88% at the time of my evaluation, Temperature 97.5.  WBC 10.1.  Potassium 3.3.  Hemoglobin down to 8.3.  Lactic acid 1.7.  Covid and influenza test negative.  Vancomycin and cefepime started.  Portable chest x-ray shows complete opacification of the left lung, subsequent CT chest with contrast-complete collapse of left lung with very large left pleural effusion, multiple heterogeneous pleural-based soft tissue masses of various sizes on the left likely consistent with underlying neoplastic process.  PET/CT recommended. Hospitalist to admit for further evaluation and management.  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 -Will follow results of thoracentesis; preliminary results suggesting exudate. -Continue to wean her off oxygen supplementation as tolerated if possible -Continue supportive care.  2-Large  pleural effusion -As mentioned above with high concern for malignant effusion -Follow pathology results.  3-Dementia ([place]) -No behavioral disturbances appreciated at this time -Continue supportive care and consult reorientation.  4-depression/anxiety -Continue Celexa and BuSpar.  5-history of Essential hypertension -Overall stable neck-continue to follow vital signs -No using antihypertensive agents.  6-Hypothyroidism -Continue Synthroid  7-anemia -No signs of overt bleeding appreciated -will follow Hgb and transfuse as needed -current Hgb after IVF's 7.8; some hemodilutional effect playing a role most likely.   DVT prophylaxis: SCDs Code Status: DNR/DNI Family Communication: Daughter (POA) updated over the phone. Disposition:   Status is: Inpatient  Dispo: The patient is from: ALF              Anticipated d/c is to: ALF              Anticipated d/c date is: 1-2 days.              Patient currently is not medically stable for discharge currently; still requiring oxygen supplementation and would like to assess recurrent see after thoracentesis of her pleural effusion.  No chest pain, no nausea, no vomiting.  Continue providing supportive care.       Consultants:   Radiology   Procedures:  See below for x-ray reports. Thoracentesis 12/06/2019: 1.2 L of dark blood fluid removed.  Have been sent to the lab for further studies and results.   Antimicrobials:  None   Subjective: Pleasantly confused; no chest pain, no nausea or vomiting.  Using 1 L nasal cannula supplementation intermittently.  Patient is afebrile.  Objective: Vitals:   12/06/19 0646 12/06/19 1040 12/06/19 1110 12/06/19 1333  BP: 124/76 127/78 139/74 Marland Kitchen)  96/59  Pulse: 72 71 70 67  Resp:  18 20 17   Temp: 97.9 F (36.6 C) 98.1 F (36.7 C) 98.1 F (36.7 C) 97.7 F (36.5 C)  TempSrc: Oral Oral Oral   SpO2: 100% 93% 93% 100%  Weight:      Height:        Intake/Output Summary (Last 24 hours)  at 12/06/2019 1735 Last data filed at 12/06/2019 1300 Gross per 24 hour  Intake 120 ml  Output 300 ml  Net -180 ml   Filed Weights   12/05/19 1431 12/05/19 2306  Weight: 58 kg 58 kg    Examination:  General exam: Appears in no major distress; pleasantly confused and denying chest pain or fever.  Improve in her shortness of breath and currently using 1 L nasal cannula supplementation intermittently.  No nausea, no vomiting. Respiratory system: Positive scattered rhonchi; decreased breath sounds on the left lung field.  No wheezing.  No using accessory muscles. Cardiovascular system: RRR, no rubs, no gallops, no JVD on exam. Gastrointestinal system: Abdomen is nondistended, soft and nontender. No organomegaly or masses felt. Normal bowel sounds heard. Central nervous system: Moving 4 limbs spontaneously; having difficulty to follow commands secondary to underlying dementia. Extremities: Trace to 1+ edema bilaterally; no cyanosis or clubbing. Skin: No rashes, no petechiae. Psychiatry: Mood & affect appropriate.     Data Reviewed: I have personally reviewed following labs and imaging studies  CBC: Recent Labs  Lab 12/05/19 1352 12/06/19 0615  WBC 10.1 11.5*  NEUTROABS 7.7  --   HGB 8.3* 7.8*  HCT 26.1* 25.7*  MCV 93.5 94.1  PLT 410* 878    Basic Metabolic Panel: Recent Labs  Lab 12/05/19 1352 12/06/19 0615  NA 145 138  K 3.3* 3.8  CL 101 100  CO2 28 28  GLUCOSE 124* 152*  BUN 22 18  CREATININE 0.48 0.51  CALCIUM 8.7* 8.6*    GFR: Estimated Creatinine Clearance: 40.4 mL/min (by C-G formula based on SCr of 0.51 mg/dL).  Liver Function Tests: Recent Labs  Lab 12/05/19 1352  AST 15  ALT 15  ALKPHOS 70  BILITOT 0.6  PROT 6.5  ALBUMIN 2.7*    CBG: No results for input(s): GLUCAP in the last 168 hours.   Recent Results (from the past 240 hour(s))  Respiratory Panel by RT PCR (Flu A&B, Covid) - Nasopharyngeal Swab     Status: None   Collection Time:  12/05/19 11:19 AM   Specimen: Nasopharyngeal Swab  Result Value Ref Range Status   SARS Coronavirus 2 by RT PCR NEGATIVE NEGATIVE Final    Comment: (NOTE) SARS-CoV-2 target nucleic acids are NOT DETECTED.  The SARS-CoV-2 RNA is generally detectable in upper respiratoy specimens during the acute phase of infection. The lowest concentration of SARS-CoV-2 viral copies this assay can detect is 131 copies/mL. A negative result does not preclude SARS-Cov-2 infection and should not be used as the sole basis for treatment or other patient management decisions. A negative result may occur with  improper specimen collection/handling, submission of specimen other than nasopharyngeal swab, presence of viral mutation(s) within the areas targeted by this assay, and inadequate number of viral copies (<131 copies/mL). A negative result must be combined with clinical observations, patient history, and epidemiological information. The expected result is Negative.  Fact Sheet for Patients:  PinkCheek.be  Fact Sheet for Healthcare Providers:  GravelBags.it  This test is no t yet approved or cleared by the [place] FDA and  has  been authorized for detection and/or diagnosis of SARS-CoV-2 by FDA under an Emergency Use Authorization (EUA). This EUA will remain  in effect (meaning this test can be used) for the duration of the COVID-19 declaration under Section 564(b)(1) of the Act, 21 U.S.C. section 360bbb-3(b)(1), unless the authorization is terminated or revoked sooner.     Influenza A by PCR NEGATIVE NEGATIVE Final   Influenza B by PCR NEGATIVE NEGATIVE Final    Comment: (NOTE) The Xpert Xpress SARS-CoV-2/FLU/RSV assay is intended as an aid in  the diagnosis of influenza from Nasopharyngeal swab specimens and  should not be used as a sole basis for treatment. Nasal washings and  aspirates are unacceptable for Xpert Xpress  SARS-CoV-2/FLU/RSV  testing.  Fact Sheet for Patients: PinkCheek.be  Fact Sheet for Healthcare Providers: GravelBags.it  This test is not yet approved or cleared by the [place] FDA and  has been authorized for detection and/or diagnosis of SARS-CoV-2 by  FDA under an Emergency Use Authorization (EUA). This EUA will remain  in effect (meaning this test can be used) for the duration of the  Covid-19 declaration under Section 564(b)(1) of the Act, 21  U.S.C. section 360bbb-3(b)(1), unless the authorization is  terminated or revoked. Performed at [facility], [address], [place], [place] [address]   Blood culture (routine x 2)     Status: None (Preliminary result)   Collection Time: 12/05/19  1:53 PM   Specimen: Right Antecubital; Blood  Result Value Ref Range Status   Specimen Description   Final    RIGHT ANTECUBITAL BOTTLES DRAWN AEROBIC AND ANAEROBIC   Special Requests Blood Culture adequate volume  Final   Culture   Final    NO GROWTH 1 DAY Performed at [facility], [address] [place], [place] [address]    Report Status PENDING  Incomplete  Blood culture (routine x 2)     Status: None (Preliminary result)   Collection Time: 12/05/19  1:54 PM   Specimen: Left Antecubital; Blood  Result Value Ref Range Status   Specimen Description   Final    LEFT ANTECUBITAL BOTTLES DRAWN AEROBIC AND ANAEROBIC   Special Requests Blood Culture adequate volume  Final   Culture   Final    NO GROWTH 1 DAY Performed at [facility], [address] [place], [place] [address]    Report Status PENDING  Incomplete  Gram stain     Status: None   Collection Time: 12/06/19 11:00 AM   Specimen: Body Fluid  Result Value Ref Range Status   Specimen Description FLUID LEFT PLEURAL  Final   Special Requests NONE  Final   Gram Stain   Final    RARE WBC PRESENT,BOTH PMN AND MONONUCLEAR NO ORGANISMS SEEN Performed at [facility] Performed at [facility], [address], [place], [place] [address]    Report Status 12/06/2019 FINAL  Final  Culture, body fluid-bottle     Status: None (Preliminary result)   Collection Time: 12/06/19 11:00 AM   Specimen: Fluid  Result Value Ref Range Status   Specimen Description FLUID LEFT PLEURAL  Final   Special Requests BOTTLES DRAWN AEROBIC AND ANAEROBIC 10CC EACH  Final   Culture   Final    NO GROWTH < 12 HOURS Performed at [facility], [address] [place], [place] [address]    Report Status PENDING  Incomplete     Radiology Studies: DG Chest 1 View  Result Date: 12/06/2019 CLINICAL DATA:  LEFT  pleural effusion post thoracentesis EXAM: CHEST  1 VIEW COMPARISON:  12/05/2019 FINDINGS: Persistent opacification of the LEFT hemithorax unchanged from previous exam. Skin folds project over LEFT chest. Accentuation of RIGHT perihilar markings unchanged. No pneumothorax. Bones demineralized. IMPRESSION: Persistent complete opacification of the LEFT hemithorax by pleural effusion and atelectasis of LEFT lung. No pneumothorax following LEFT thoracentesis. Electronically Signed   By: Lavonia Dana M.D.   On: 12/06/2019 13:34   CT Chest W Contrast  Result Date: 12/05/2019 CLINICAL DATA:  Shortness of breath. EXAM: CT CHEST WITH CONTRAST TECHNIQUE: Multidetector CT imaging of the chest was performed during intravenous contrast administration. CONTRAST:  48mL OMNIPAQUE IOHEXOL 300 MG/ML  SOLN COMPARISON:  None. FINDINGS: Cardiovascular: There is moderate severity calcification of the aortic arch. Normal heart size with moderate severity coronary artery calcification. A very small amount of pericardial fluid is seen. Mediastinum/Nodes: Mild left to right shift of the mediastinal structures is noted. No enlarged mediastinal, hilar, or axillary lymph nodes. Thyroid gland, trachea, and esophagus demonstrate no significant findings. Lungs/Pleura: Complete collapse of the left lung is seen. A  very large left pleural effusion is also noted. Multiple heterogeneous pleural based soft tissue masses of various sizes are seen on the left. No pneumothorax is seen. Upper Abdomen: Early liver cirrhosis is seen. Musculoskeletal: A chronic deformity is seen involving the body of the sternum. Chronic compression fracture deformities are seen at the levels of T8, T12 and L1. Multilevel degenerative changes are noted throughout the remainder of the thoracic spine. IMPRESSION: 1. Complete collapse of the left lung with a very large left pleural effusion. 2. Multiple heterogeneous pleural based soft tissue masses of various sizes on the left, likely consistent with an underlying neoplastic process. Correlation with a nuclear medicine PET/CT is recommended. 3. Early liver cirrhosis. 4. Chronic compression fracture deformities of the thoracic and lumbar spine. 5. Aortic atherosclerosis. Aortic Atherosclerosis (ICD10-I70.0). Electronically Signed   By: Virgina Norfolk M.D.   On: 12/05/2019 15:27   DG Chest Port 1 View  Result Date: 12/05/2019 CLINICAL DATA:  Weakness EXAM: PORTABLE CHEST 1 VIEW COMPARISON:  June 20, 2017 FINDINGS: There is complete opacification of the LEFT hemithorax. Cardiomediastinal silhouette is obscured. The RIGHT lung is clear. Evaluation for volume loss or mediastinal deviation is limited secondary to patient rotation. Multilevel degenerative changes of the thoracic spine. Wedging of a vertebral body at the thoracolumbar junction. IMPRESSION: 1. Complete opacification of the LEFT hemithorax. This is favored to be due to a combination of pleural effusion and atelectasis. Differential considerations include complete lobar collapse or infection. Consider further evaluation with dedicated CT. Electronically Signed   By: Valentino Saxon MD   On: 12/05/2019 11:45   US THORACENTESIS ASP PLEURAL SPACE W/IMG GUIDE  Result Date: 12/06/2019 INDICATION: LEFT pleural effusion, pleural nodularity  on CT question malignant effusion EXAM: ULTRASOUND GUIDED DIAGNOSTIC AND THERAPEUTIC THORACENTESIS MEDICATIONS: None. COMPLICATIONS: None immediate. PROCEDURE: An ultrasound guided thoracentesis was thoroughly discussed with the patient's daughter, who is power of attorney for the patient. Procedure and risks as well as benefits discussed. Witnessed verbal telephone consent was obtained. Ultrasound was performed to localize and mark an adequate pocket of fluid in the posterior LEFT chest. The area was then prepped and draped in the normal sterile fashion. 1% Lidocaine was used for local anesthesia. Under ultrasound guidance a 8 French thoracentesis catheter was introduced. Thoracentesis was performed. The catheter was removed and a dressing applied. FINDINGS: A total of approximately 1.2 L of dark old  bloody LEFT pleural fluid was removed. Samples were sent to the laboratory as requested by the clinical team. IMPRESSION: Successful ultrasound guided LEFT thoracentesis yielding 1.2 L of dark old bloody LEFT pleural fluid. Electronically Signed   By: Lavonia Dana M.D.   On: 12/06/2019 11:24    Scheduled Meds: . busPIRone  10 mg Oral q AM  . citalopram  10 mg Oral QHS  . feeding supplement (ENSURE ENLIVE)   Oral TID WC  . levothyroxine  200 mcg Oral QAC breakfast  . pantoprazole  40 mg Oral Daily   Continuous Infusions:   LOS: 1 day    Time spent: 30 minutes   Barton Dubois, MD [facility]   To contact the attending provider between 7A-7P or the covering provider during after hours 7P-7A, please log into the web site [email or web address] and access using universal Door password for that web site. If you do not have the password, please call the hospital operator.  12/06/2019, 5:35 PM

## 2019-12-06 NOTE — Plan of Care (Signed)

## 2019-12-07 DIAGNOSIS — D509 Iron deficiency anemia, unspecified: Secondary | ICD-10-CM

## 2019-12-07 DIAGNOSIS — Z9889 Other specified postprocedural states: Secondary | ICD-10-CM

## 2019-12-07 LAB — PROTEIN, BODY FLUID (OTHER): Total Protein, Body Fluid Other: 4.4 g/dL

## 2019-12-07 LAB — TRIGLYCERIDES, BODY FLUIDS: Triglycerides, Fluid: 56 mg/dL

## 2019-12-07 MED ORDER — IRON POLYSACCH CMPLX-B12-FA 150-0.025-1 MG PO CAPS: 1.0000 | ORAL_CAPSULE | Freq: Every day | ORAL | 3 refills | Status: AC

## 2019-12-07 NOTE — Plan of Care (Signed)
  Problem: Education: Goal: Knowledge of General Education information will improve Description: Including pain rating scale, medication(s)/side effects and non-pharmacologic comfort measures 12/07/2019 0732 by Melony Overly, RN Outcome: Progressing 12/07/2019 0732 by Melony Overly, RN Outcome: Progressing   Problem: Health Behavior/Discharge Planning: Goal: Ability to manage health-related needs will improve 12/07/2019 0732 by Melony Overly, RN Outcome: Progressing 12/07/2019 0732 by Melony Overly, RN Outcome: Progressing   Problem: Clinical Measurements: Goal: Ability to maintain clinical measurements within normal limits will improve 12/07/2019 0732 by Melony Overly, RN Outcome: Progressing 12/07/2019 0732 by Melony Overly, RN Outcome: Progressing Goal: Will remain free from infection 12/07/2019 0732 by Melony Overly, RN Outcome: Progressing 12/07/2019 0732 by Melony Overly, RN Outcome: Progressing Goal: Diagnostic test results will improve 12/07/2019 0732 by Melony Overly, RN Outcome: Progressing 12/07/2019 0732 by Melony Overly, RN Outcome: Progressing Goal: Respiratory complications will improve 12/07/2019 0732 by Melony Overly, RN Outcome: Progressing 12/07/2019 0732 by Melony Overly, RN Outcome: Progressing Goal: Cardiovascular complication will be avoided 12/07/2019 0732 by Melony Overly, RN Outcome: Progressing 12/07/2019 0732 by Melony Overly, RN Outcome: Progressing   Problem: Activity: Goal: Risk for activity intolerance will decrease 12/07/2019 0732 by Melony Overly, RN Outcome: Progressing 12/07/2019 0732 by Melony Overly, RN Outcome: Progressing   Problem: Nutrition: Goal: Adequate nutrition will be maintained 12/07/2019 0732 by Melony Overly, RN Outcome: Progressing 12/07/2019 0732 by Melony Overly, RN Outcome: Progressing   Problem: Coping: Goal: Level of anxiety will decrease 12/07/2019 0732 by Melony Overly, RN Outcome:  Progressing 12/07/2019 0732 by Melony Overly, RN Outcome: Progressing   Problem: Elimination: Goal: Will not experience complications related to bowel motility 12/07/2019 0732 by Melony Overly, RN Outcome: Progressing 12/07/2019 0732 by Melony Overly, RN Outcome: Progressing Goal: Will not experience complications related to urinary retention 12/07/2019 0732 by Melony Overly, RN Outcome: Progressing 12/07/2019 0732 by Melony Overly, RN Outcome: Progressing   Problem: Pain Managment: Goal: General experience of comfort will improve 12/07/2019 0732 by Melony Overly, RN Outcome: Progressing 12/07/2019 0732 by Melony Overly, RN Outcome: Progressing   Problem: Safety: Goal: Ability to remain free from injury will improve 12/07/2019 0732 by Melony Overly, RN Outcome: Progressing 12/07/2019 0732 by Melony Overly, RN Outcome: Progressing   Problem: Skin Integrity: Goal: Risk for impaired skin integrity will decrease 12/07/2019 0732 by Melony Overly, RN Outcome: Progressing 12/07/2019 0732 by Melony Overly, RN Outcome: Progressing

## 2019-12-07 NOTE — NC FL2 (Signed)
[place] LEVEL OF CARE SCREENING TOOL     IDENTIFICATION  Patient Name: Anita Powell Birthdate: 07/11/1926 Sex: female Admission Date (Current Location): 12/05/2019  [facility] and [place] Number:  [employer] and Address:  [place] [address], [place]      Provider Number: [id number]  Attending Physician Name and Address:  Barton Dubois, MD  Relative Name and Phone Number:       Current Level of Care: Hospital Recommended Level of Care: [place] Prior Approval Number:    Date Approved/Denied:   PASRR Number:    Discharge Plan: Other (Comment) (ALF)    Current Diagnoses: Patient Active Problem List   Diagnosis Date Noted  . Status post thoracentesis   . Iron deficiency anemia   . Large pleural effusion 12/05/2019  . Dementia ([place]) 12/05/2019  . Essential hypertension 12/05/2019  . Hypothyroidism 12/05/2019  . Closed left hip fracture ([place]) 10/28/2015  . Closed right hip fracture ([place]) 04/07/2015    Orientation RESPIRATION BLADDER Height & Weight     Self  O2 (2 L) External catheter Weight: 127 lb 13.9 oz (58 kg) Height:  5\' 4"  (162.6 cm)  BEHAVIORAL SYMPTOMS/MOOD NEUROLOGICAL BOWEL NUTRITION STATUS      Incontinent Diet (Regular)  AMBULATORY STATUS COMMUNICATION OF NEEDS Skin   Extensive Assist Verbally Bruising                       Personal Care Assistance Level of Assistance  Bathing, Feeding, Dressing Bathing Assistance: Maximum assistance Feeding assistance: Limited assistance Dressing Assistance: Maximum assistance     Functional Limitations Info  Sight, Hearing, Speech Sight Info: Adequate Hearing Info: Adequate Speech Info: Adequate    SPECIAL CARE FACTORS FREQUENCY                       Contractures Contractures Info: Not present    Additional Factors Info  Code Status, Allergies, Psychotropic Code Status Info: DNR Allergies Info: No known  allergies Psychotropic Info: Celexa, Buspar         Current Medications (12/07/2019):  This is the current hospital active medication list Current Facility-Administered Medications  Medication Dose Route Frequency Provider Last Rate Last Admin  . acetaminophen (TYLENOL) tablet 650 mg  650 mg Oral Q6H PRN Emokpae, Ejiroghene E, MD       Or  . acetaminophen (TYLENOL) suppository 650 mg  650 mg Rectal Q6H PRN Emokpae, Ejiroghene E, MD      . busPIRone (BUSPAR) tablet 10 mg  10 mg Oral q AM Emokpae, Ejiroghene E, MD   10 mg at 12/07/19 0532  . citalopram (CELEXA) tablet 10 mg  10 mg Oral QHS Emokpae, Ejiroghene E, MD   10 mg at 12/06/19 2135  . feeding supplement (ENSURE ENLIVE) (ENSURE ENLIVE) liquid   Oral TID WC Emokpae, Ejiroghene E, MD   Given at 12/07/19 0855  . guaiFENesin-dextromethorphan (ROBITUSSIN DM) 100-10 MG/5ML syrup 10 mL  10 mL Oral Q8H PRN Emokpae, Ejiroghene E, MD      . ipratropium-albuterol (DUONEB) 0.5-2.5 (3) MG/3ML nebulizer solution 3 mL  3 mL Nebulization Q6H PRN Emokpae, Ejiroghene E, MD      . levothyroxine (SYNTHROID) tablet 200 mcg  200 mcg Oral QAC breakfast Emokpae, Ejiroghene E, MD   200 mcg at 12/07/19 0531  . ondansetron (ZOFRAN) tablet 4 mg  4 mg Oral Q6H PRN Emokpae, Leanne Chang, MD  Or  . ondansetron (ZOFRAN) injection 4 mg  4 mg Intravenous Q6H PRN Emokpae, Ejiroghene E, MD      . pantoprazole (PROTONIX) EC tablet 40 mg  40 mg Oral Daily Emokpae, Ejiroghene E, MD   40 mg at 12/07/19 0855  . polyethylene glycol (MIRALAX / GLYCOLAX) packet 17 g  17 g Oral Daily PRN Emokpae, Ejiroghene E, MD         Discharge Medications:  STOP taking these medications   aspirin EC 81 MG tablet   vitamin B-12 1000 MCG tablet Commonly known as: CYANOCOBALAMIN     TAKE these medications   acetaminophen 500 MG tablet Commonly known as: TYLENOL Take 500 mg by mouth every 6 (six) hours as needed for mild pain or headache (fever >101 or severe discomfort). What  changed: Another medication with the same name was removed. Continue taking this medication, and follow the directions you see here.   busPIRone 10 MG tablet Commonly known as: BUSPAR Take 10 mg by mouth in the morning.   citalopram 10 MG tablet Commonly known as: CELEXA Take 10 mg by mouth at bedtime.   Iron Polysacch Cmplx-B12-FA 150-0.025-1 MG Caps Take 1 capsule by mouth daily.   levothyroxine 200 MCG tablet Commonly known as: SYNTHROID Take 200 mcg by mouth daily before breakfast.   loperamide 2 MG capsule Commonly known as: IMODIUM Take 2 mg by mouth as needed for diarrhea or loose stools.   magnesium hydroxide 400 MG/5ML suspension Commonly known as: MILK OF MAGNESIA Take 30 mLs by mouth at bedtime as needed for mild constipation.   metoCLOPramide 10 MG tablet Commonly known as: REGLAN Take 1 tablet (10 mg total) by mouth every 8 (eight) hours as needed for nausea.   Mintox 355-974-16 MG/5ML suspension Generic drug: alum & mag hydroxide-simeth Take 30 mLs by mouth every 6 (six) hours as needed for indigestion or heartburn.   neomycin-bacitracin-polymyxin [id number] ointment Apply 1 application topically as needed (minor skin tears or abrasions).   NUTRITIONAL SHAKE PO Take 118 mLs by mouth 3 (three) times daily with meals.   omeprazole 20 MG capsule Commonly known as: PRILOSEC Take 20 mg by mouth at bedtime.   Vitamin D 125 MCG (5000 UT) Caps Take 5,000 Units by mouth in the morning.        Relevant Imaging Results:  Relevant Lab Results:   Additional Information Refer for hospice at home  Shade Flood, LCSW

## 2019-12-07 NOTE — TOC Transition Note (Signed)
Transition of Care [facility]) - CM/SW Discharge Note   Patient Details  Name: JODEE WAGENAAR MRN: [id number] Date of Birth: Jan 21, 1927  [facility]) CM/SW Contact:  Shade Flood, LCSW Phone Number: 12/07/2019, 2:30 PM   Clinical Narrative:     Pt stable for dc today per MD. Tomasa Rand at [facility]. Updated pt's daughter. DC clinical sent electronically. EMS arranged. Updated RN.  There are no other TOC needs for dc.  Final next level of care: Assisted Living Barriers to Discharge: Barriers Resolved   Patient Goals and CMS Choice Patient states their goals for this hospitalization and ongoing recovery are:: return to ALF      Discharge Placement                       Discharge Plan and Services In-house Referral: Clinical Social Work                                   Social Determinants of Health (SDOH) Interventions     Readmission Risk Interventions No flowsheet data found.

## 2019-12-07 NOTE — Discharge Summary (Signed)
Physician Discharge Summary  Anita Powell RXV:[id number] DOB: Apr 29, 1926 DOA: 12/05/2019  PCP: Patient, No Pcp Per  Admit date: 12/05/2019 Discharge date: 12/07/2019  Time spent: 35 minutes  Recommendations for Outpatient Follow-up:  1. Repeat CBC to follow hemoglobin trend 2. Please follow final results of pleural effusion analysis. 3. Home hospice.   Discharge Diagnoses:  Principal Problem:   Large pleural effusion Active Problems:   Dementia ([place])   Essential hypertension   Hypothyroidism   Status post thoracentesis   Iron deficiency anemia   Discharge Condition: Stable and improved.  No requiring oxygen supplementation at discharge.  Patient discharged back to assisted living facility with intention to establish care with home hospice.  Follow-up with PCP in 10 days.  CODE STATUS: DNR/DNI   Diet recommendation: Heart healthy diet.  Filed Weights   12/05/19 1431 12/05/19 2306  Weight: 58 kg 58 kg    History of present illness:  As per H&P written by Dr. Denton Powell on 01/01/2020 Anita Powell a 86 y.o.femalewith medical history significant forbreast cancer, dementia, hypertension, hypothyroidism. Patient was brought to the ED with complaints of increasing difficulty breathing and cough over the past 2 weeks. At the time of my evaluation, patient is able to providelimited history. She tells me her cough is more severe than the difficulty breathing. She denies black stools,blood in stools orvomiting of blood.  ED Course:O2 sats down to 88% at the time of my evaluation, Temperature 97.5. WBC 10.1. Potassium 3.3. Hemoglobin down to 8.3. Lactic acid 1.7. Covid and influenza test negative. Vancomycin and cefepime started. Portable chest x-ray shows complete opacification of the left lung,subsequent CT chest with contrast-complete collapse of left lung with very large left pleural effusion, multiple heterogeneous pleural-based soft tissue masses of various  sizes on the left likely consistent with underlying neoplastic process. PET/CT recommended. Hospitalisttoadmit for further evaluation and management.  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 -Patient with prior history of breast cancer -Significant abnormalities appreciated on CT scan of the chest with recommendations to pursue PET scan; after discussing with family member (patient's POA) decision was made to keep patient comfortable and focus on quality with no interventions for invasive management. -Pleural effusion preliminary results suggesting exudate. -Follow final fluid results and pathology. -No requiring oxygen supplementation at discharge. -Continue providing supportive care as per discussion with 86 daughter will recommend home hospice.  2-Large pleural effusion -As mentioned above with high concern for malignant effusion -Follow final pathology results.  3-Dementia ([place]) -No behavioral disturbances appreciated at this time. -Continue supportive care and constant reorientation.  4-depression/anxiety -Continue Celexa and BuSpar. -Overall stable mood -No suicidal ideation or hallucinations.  5-history of Essential hypertension -Overall stable  -continue to follow vital signs intermittently -Patient was not using antihypertensive agents prior to admission. -Continue heart healthy diet.  6-Hypothyroidism -Continue Synthroid  7-anemia -No signs of overt bleeding appreciated -will recommend CBC to follow hemoglobin trend and transfuse as needed -Hemoglobin at discharge 7.8; per anemia panel there is some iron deficiency component along with anemia of chronic disease. -Discharged on Niferex  Procedures:  See below for x-ray reports.  Consultations:  None  Discharge Exam: Vitals:   12/07/19 0748 12/07/19 0858  BP: 138/72   Pulse: 62   Resp: 14   Temp: 98.1 F (36.7 C)    SpO2:  98%    General: Afebrile, no chest pain, no nausea, no vomiting.  Reports breathing to be stable  and is in no distress.  Pleasantly confused. Cardiovascular: S1 and S2, no rubs, no gallops, no JVD appreciated on exam. Respiratory: Decreased breath sounds appreciated at the left bases; no using accessory muscle.  Good air movement bilaterally otherwise.  Positive scattered rhonchi. Abdomen: Soft, nontender, nondistended, positive bowel sounds Extremities: no cyanosis or clubbing.  Discharge Instructions   Discharge Instructions    Discharge instructions   Complete by: As directed    Continue adequate hydration Arrange follow-up with PCP in 10 days Take medications as prescribed   Increase activity slowly   Complete by: As directed      Allergies as of 12/07/2019   No Known Allergies     Medication List    STOP taking these medications   aspirin EC 81 MG tablet   vitamin B-12 1000 MCG tablet Commonly known as: CYANOCOBALAMIN     TAKE these medications   acetaminophen 500 MG tablet Commonly known as: TYLENOL Take 500 mg by mouth every 6 (six) hours as needed for mild pain or headache (fever >101 or severe discomfort). What changed: Another medication with the same name was removed. Continue taking this medication, and follow the directions you see here.   busPIRone 10 MG tablet Commonly known as: BUSPAR Take 10 mg by mouth in the morning.   citalopram 10 MG tablet Commonly known as: CELEXA Take 10 mg by mouth at bedtime.   Iron Polysacch Cmplx-B12-FA 150-0.025-1 MG Caps Take 1 capsule by mouth daily.   levothyroxine 200 MCG tablet Commonly known as: SYNTHROID Take 200 mcg by mouth daily before breakfast.   loperamide 2 MG capsule Commonly known as: IMODIUM Take 2 mg by mouth as needed for diarrhea or loose stools.   magnesium hydroxide 400 MG/5ML suspension Commonly known as: MILK OF MAGNESIA Take 30 mLs by mouth at bedtime as needed for mild  constipation.   metoCLOPramide 10 MG tablet Commonly known as: REGLAN Take 1 tablet (10 mg total) by mouth every 8 (eight) hours as needed for nausea.   Mintox 564-332-95 MG/5ML suspension Generic drug: alum & mag hydroxide-simeth Take 30 mLs by mouth every 6 (six) hours as needed for indigestion or heartburn.   neomycin-bacitracin-polymyxin [id number] ointment Apply 1 application topically as needed (minor skin tears or abrasions).   NUTRITIONAL SHAKE PO Take 118 mLs by mouth 3 (three) times daily with meals.   omeprazole 20 MG capsule Commonly known as: PRILOSEC Take 20 mg by mouth at bedtime.   Vitamin D 125 MCG (5000 UT) Caps Take 5,000 Units by mouth in the morning.      No Known Allergies   The results of significant diagnostics from this hospitalization (including imaging, microbiology, ancillary and laboratory) are listed below for reference.    Significant Diagnostic Studies: DG Chest 1 View  Result Date: 12/06/2019 CLINICAL DATA:  LEFT pleural effusion post thoracentesis EXAM: CHEST  1 VIEW COMPARISON:  12/05/2019 FINDINGS: Persistent opacification of the LEFT hemithorax unchanged from previous exam. Skin folds project over LEFT chest. Accentuation of RIGHT perihilar markings unchanged. No pneumothorax. Bones demineralized. IMPRESSION: Persistent complete opacification of the LEFT hemithorax by pleural effusion and atelectasis of LEFT lung. No pneumothorax following LEFT thoracentesis. Electronically Signed   By: Lavonia Dana M.D.   On: 12/06/2019 13:34   CT Chest W Contrast  Result Date: 12/05/2019 CLINICAL DATA:  Shortness of breath. EXAM: CT CHEST WITH CONTRAST TECHNIQUE: Multidetector CT imaging of the chest was performed during intravenous contrast administration. CONTRAST:  36mL OMNIPAQUE IOHEXOL  300 MG/ML  SOLN COMPARISON:  None. FINDINGS: Cardiovascular: There is moderate severity calcification of the aortic arch. Normal heart size with moderate severity coronary  artery calcification. A very small amount of pericardial fluid is seen. Mediastinum/Nodes: Mild left to right shift of the mediastinal structures is noted. No enlarged mediastinal, hilar, or axillary lymph nodes. Thyroid gland, trachea, and esophagus demonstrate no significant findings. Lungs/Pleura: Complete collapse of the left lung is seen. A very large left pleural effusion is also noted. Multiple heterogeneous pleural based soft tissue masses of various sizes are seen on the left. No pneumothorax is seen. Upper Abdomen: Early liver cirrhosis is seen. Musculoskeletal: A chronic deformity is seen involving the body of the sternum. Chronic compression fracture deformities are seen at the levels of T8, T12 and L1. Multilevel degenerative changes are noted throughout the remainder of the thoracic spine. IMPRESSION: 1. Complete collapse of the left lung with a very large left pleural effusion. 2. Multiple heterogeneous pleural based soft tissue masses of various sizes on the left, likely consistent with an underlying neoplastic process. Correlation with a nuclear medicine PET/CT is recommended. 3. Early liver cirrhosis. 4. Chronic compression fracture deformities of the thoracic and lumbar spine. 5. Aortic atherosclerosis. Aortic Atherosclerosis (ICD10-I70.0). Electronically Signed   By: Virgina Norfolk M.D.   On: 12/05/2019 15:27   DG Chest Port 1 View  Result Date: 12/05/2019 CLINICAL DATA:  Weakness EXAM: PORTABLE CHEST 1 VIEW COMPARISON:  June 20, 2017 FINDINGS: There is complete opacification of the LEFT hemithorax. Cardiomediastinal silhouette is obscured. The RIGHT lung is clear. Evaluation for volume loss or mediastinal deviation is limited secondary to patient rotation. Multilevel degenerative changes of the thoracic spine. Wedging of a vertebral body at the thoracolumbar junction. IMPRESSION: 1. Complete opacification of the LEFT hemithorax. This is favored to be due to a combination of pleural  effusion and atelectasis. Differential considerations include complete lobar collapse or infection. Consider further evaluation with dedicated CT. Electronically Signed   By: Valentino Saxon MD   On: 12/05/2019 11:45   US THORACENTESIS ASP PLEURAL SPACE W/IMG GUIDE  Result Date: 12/06/2019 INDICATION: LEFT pleural effusion, pleural nodularity on CT question malignant effusion EXAM: ULTRASOUND GUIDED DIAGNOSTIC AND THERAPEUTIC THORACENTESIS MEDICATIONS: None. COMPLICATIONS: None immediate. PROCEDURE: An ultrasound guided thoracentesis was thoroughly discussed with the patient's daughter, who is power of attorney for the patient. Procedure and risks as well as benefits discussed. Witnessed verbal telephone consent was obtained. Ultrasound was performed to localize and mark an adequate pocket of fluid in the posterior LEFT chest. The area was then prepped and draped in the normal sterile fashion. 1% Lidocaine was used for local anesthesia. Under ultrasound guidance a 8 French thoracentesis catheter was introduced. Thoracentesis was performed. The catheter was removed and a dressing applied. FINDINGS: A total of approximately 1.2 L of dark old bloody LEFT pleural fluid was removed. Samples were sent to the laboratory as requested by the clinical team. IMPRESSION: Successful ultrasound guided LEFT thoracentesis yielding 1.2 L of dark old bloody LEFT pleural fluid. Electronically Signed   By: Lavonia Dana M.D.   On: 12/06/2019 11:24    Microbiology: Recent Results (from the past 240 hour(s))  Respiratory Panel by RT PCR (Flu A&B, Covid) - Nasopharyngeal Swab     Status: None   Collection Time: 12/05/19 11:19 AM   Specimen: Nasopharyngeal Swab  Result Value Ref Range Status   SARS Coronavirus 2 by RT PCR NEGATIVE NEGATIVE Final    Comment: (NOTE) SARS-CoV-2 target  nucleic acids are NOT DETECTED.  The SARS-CoV-2 RNA is generally detectable in upper respiratoy specimens during the acute phase of  infection. The lowest concentration of SARS-CoV-2 viral copies this assay can detect is 131 copies/mL. A negative result does not preclude SARS-Cov-2 infection and should not be used as the sole basis for treatment or other patient management decisions. A negative result may occur with  improper specimen collection/handling, submission of specimen other than nasopharyngeal swab, presence of viral mutation(s) within the areas targeted by this assay, and inadequate number of viral copies (<131 copies/mL). A negative result must be combined with clinical observations, patient history, and epidemiological information. The expected result is Negative.  Fact Sheet for Patients:  PinkCheek.be  Fact Sheet for Healthcare Providers:  GravelBags.it  This test is no t yet approved or cleared by the [place] FDA and  has been authorized for detection and/or diagnosis of SARS-CoV-2 by FDA under an Emergency Use Authorization (EUA). This EUA will remain  in effect (meaning this test can be used) for the duration of the COVID-19 declaration under Section 564(b)(1) of the Act, 21 U.S.C. section 360bbb-3(b)(1), unless the authorization is terminated or revoked sooner.     Influenza A by PCR NEGATIVE NEGATIVE Final   Influenza B by PCR NEGATIVE NEGATIVE Final    Comment: (NOTE) The Xpert Xpress SARS-CoV-2/FLU/RSV assay is intended as an aid in  the diagnosis of influenza from Nasopharyngeal swab specimens and  should not be used as a sole basis for treatment. Nasal washings and  aspirates are unacceptable for Xpert Xpress SARS-CoV-2/FLU/RSV  testing.  Fact Sheet for Patients: PinkCheek.be  Fact Sheet for Healthcare Providers: GravelBags.it  This test is not yet approved or cleared by the [place] FDA and  has been authorized for detection and/or diagnosis of SARS-CoV-2  by  FDA under an Emergency Use Authorization (EUA). This EUA will remain  in effect (meaning this test can be used) for the duration of the  Covid-19 declaration under Section 564(b)(1) of the Act, 21  U.S.C. section 360bbb-3(b)(1), unless the authorization is  terminated or revoked. Performed at [facility], [address], [place], [place] [address]   Blood culture (routine x 2)     Status: None (Preliminary result)   Collection Time: 12/05/19  1:53 PM   Specimen: Right Antecubital; Blood  Result Value Ref Range Status   Specimen Description   Final    RIGHT ANTECUBITAL BOTTLES DRAWN AEROBIC AND ANAEROBIC   Special Requests Blood Culture adequate volume  Final   Culture   Final    NO GROWTH 2 DAYS Performed at [facility], [address], [place], [place] [address]    Report Status PENDING  Incomplete  Blood culture (routine x 2)     Status: None (Preliminary result)   Collection Time: 12/05/19  1:54 PM   Specimen: Left Antecubital; Blood  Result Value Ref Range Status   Specimen Description   Final    LEFT ANTECUBITAL BOTTLES DRAWN AEROBIC AND ANAEROBIC   Special Requests Blood Culture adequate volume  Final   Culture   Final    NO GROWTH 2 DAYS Performed at [facility], [address], [place],  [address]    Report Status PENDING  Incomplete  Gram stain     Status: None   Collection Time: 12/06/19 11:00 AM   Specimen: Body Fluid  Result Value Ref Range Status   Specimen Description FLUID LEFT PLEURAL  Final   Special Requests NONE  Final   Gram Stain   Final    RARE WBC PRESENT,BOTH PMN AND MONONUCLEAR NO ORGANISMS SEEN Performed at [facility] Performed at [facility], [address], [place], [place] [address]    Report Status 12/06/2019 FINAL  Final  Culture, body fluid-bottle     Status: None (Preliminary result)   Collection Time: 12/06/19 11:00 AM   Specimen: Fluid  Result Value Ref Range Status   Specimen Description FLUID LEFT PLEURAL  Final    Special Requests BOTTLES DRAWN AEROBIC AND ANAEROBIC 10CC EACH  Final   Culture   Final    NO GROWTH < 24 HOURS Performed at [facility], [address], [place], [place] [address]    Report Status PENDING  Incomplete     Labs: Basic Metabolic Panel: Recent Labs  Lab 12/05/19 1352 12/06/19 0615  NA 145 138  K 3.3* 3.8  CL 101 100  CO2 28 28  GLUCOSE 124* 152*  BUN 22 18  CREATININE 0.48 0.51  CALCIUM 8.7* 8.6*   Liver Function Tests: Recent Labs  Lab 12/05/19 1352  AST 15  ALT 15  ALKPHOS 70  BILITOT 0.6  PROT 6.5  ALBUMIN 2.7*   CBC: Recent Labs  Lab 12/05/19 1352 12/06/19 0615  WBC 10.1 11.5*  NEUTROABS 7.7  --   HGB 8.3* 7.8*  HCT 26.1* 25.7*  MCV 93.5 94.1  PLT 410* 399    Signed:  Barton Dubois MD.  [facility] 12/07/2019, 11:25 AM

## 2019-12-07 NOTE — Plan of Care (Signed)
  Problem: Education: Goal: Knowledge of General Education information will improve Description: Including pain rating scale, medication(s)/side effects and non-pharmacologic comfort measures 12/07/2019 1104 by Melony Overly, RN Outcome: Adequate for Discharge 12/07/2019 0732 by Melony Overly, RN Outcome: Progressing   Problem: Health Behavior/Discharge Planning: Goal: Ability to manage health-related needs will improve 12/07/2019 1104 by Melony Overly, RN Outcome: Adequate for Discharge 12/07/2019 0732 by Melony Overly, RN Outcome: Progressing   Problem: Clinical Measurements: Goal: Ability to maintain clinical measurements within normal limits will improve 12/07/2019 1104 by Melony Overly, RN Outcome: Adequate for Discharge 12/07/2019 0732 by Melony Overly, RN Outcome: Progressing Goal: Will remain free from infection 12/07/2019 1104 by Melony Overly, RN Outcome: Adequate for Discharge 12/07/2019 0732 by Melony Overly, RN Outcome: Progressing Goal: Diagnostic test results will improve 12/07/2019 1104 by Melony Overly, RN Outcome: Adequate for Discharge 12/07/2019 0732 by Melony Overly, RN Outcome: Progressing Goal: Respiratory complications will improve 12/07/2019 1104 by Melony Overly, RN Outcome: Adequate for Discharge 12/07/2019 0732 by Melony Overly, RN Outcome: Progressing Goal: Cardiovascular complication will be avoided 12/07/2019 1104 by Melony Overly, RN Outcome: Adequate for Discharge 12/07/2019 0732 by Melony Overly, RN Outcome: Progressing   Problem: Activity: Goal: Risk for activity intolerance will decrease 12/07/2019 1104 by Melony Overly, RN Outcome: Adequate for Discharge 12/07/2019 0732 by Melony Overly, RN Outcome: Progressing   Problem: Nutrition: Goal: Adequate nutrition will be maintained 12/07/2019 1104 by Melony Overly, RN Outcome: Adequate for Discharge 12/07/2019 0732 by Melony Overly, RN Outcome: Progressing    Problem: Coping: Goal: Level of anxiety will decrease 12/07/2019 1104 by Melony Overly, RN Outcome: Adequate for Discharge 12/07/2019 0732 by Melony Overly, RN Outcome: Progressing   Problem: Elimination: Goal: Will not experience complications related to bowel motility 12/07/2019 1104 by Melony Overly, RN Outcome: Adequate for Discharge 12/07/2019 0732 by Melony Overly, RN Outcome: Progressing Goal: Will not experience complications related to urinary retention 12/07/2019 1104 by Melony Overly, RN Outcome: Adequate for Discharge 12/07/2019 0732 by Melony Overly, RN Outcome: Progressing   Problem: Pain Managment: Goal: General experience of comfort will improve 12/07/2019 1104 by Melony Overly, RN Outcome: Adequate for Discharge 12/07/2019 0732 by Melony Overly, RN Outcome: Progressing   Problem: Safety: Goal: Ability to remain free from injury will improve 12/07/2019 1104 by Melony Overly, RN Outcome: Adequate for Discharge 12/07/2019 0732 by Melony Overly, RN Outcome: Progressing   Problem: Skin Integrity: Goal: Risk for impaired skin integrity will decrease 12/07/2019 1104 by Melony Overly, RN Outcome: Adequate for Discharge 12/07/2019 0732 by Melony Overly, RN Outcome: Progressing

## 2019-12-07 NOTE — Plan of Care (Signed)

## 2019-12-08 LAB — ACID FAST SMEAR (AFB, MYCOBACTERIA): Acid Fast Smear: NEGATIVE

## 2019-12-08 LAB — CYTOLOGY - NON PAP

## 2019-12-08 LAB — PH, BODY FLUID: pH, Body Fluid: 7.1

## 2019-12-10 LAB — CULTURE, BLOOD (ROUTINE X 2)
Culture: NO GROWTH
Culture: NO GROWTH
Special Requests: ADEQUATE
Special Requests: ADEQUATE

## 2019-12-11 LAB — CULTURE, BODY FLUID W GRAM STAIN -BOTTLE: Culture: NO GROWTH

## 2020-01-03 DEATH — deceased

## 2020-01-19 LAB — ACID FAST CULTURE WITH REFLEXED SENSITIVITIES (MYCOBACTERIA): Acid Fast Culture: NEGATIVE
